# Patient Record
Sex: Female | Born: 1995 | Race: Black or African American | Hispanic: No | Marital: Single | Smoking: Never smoker
Health system: Southern US, Community
[De-identification: ages and names within clinical notes are randomized; demographics above are authoritative.]

## PROBLEM LIST (undated history)

## (undated) DIAGNOSIS — R51 Headache: Secondary | ICD-10-CM

## (undated) DIAGNOSIS — H539 Unspecified visual disturbance: Secondary | ICD-10-CM

## (undated) DIAGNOSIS — J45990 Exercise induced bronchospasm: Secondary | ICD-10-CM

## (undated) DIAGNOSIS — J45909 Unspecified asthma, uncomplicated: Secondary | ICD-10-CM

## (undated) HISTORY — DX: Unspecified asthma, uncomplicated: J45.909

## (undated) HISTORY — PX: NO PAST SURGERIES: SHX2092

## (undated) HISTORY — DX: Unspecified visual disturbance: H53.9

## (undated) HISTORY — DX: Exercise induced bronchospasm: J45.990

## (undated) HISTORY — DX: Headache: R51

---
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
## 2014-01-23 HISTORY — PX: LIPOMA EXCISION: SHX5283

## 2014-01-26 ENCOUNTER — Encounter: Payer: Self-pay | Admitting: Pediatrics

## 2014-01-28 ENCOUNTER — Encounter: Payer: Self-pay | Admitting: Pediatrics

## 2014-01-28 ENCOUNTER — Ambulatory Visit (INDEPENDENT_AMBULATORY_CARE_PROVIDER_SITE_OTHER): Payer: BC Managed Care – PPO | Admitting: Pediatrics

## 2014-01-28 VITALS — BP 120/78 | Ht 62.75 in | Wt 154.1 lb

## 2014-01-28 DIAGNOSIS — Z0000 Encounter for general adult medical examination without abnormal findings: Secondary | ICD-10-CM

## 2014-01-28 DIAGNOSIS — Z00129 Encounter for routine child health examination without abnormal findings: Secondary | ICD-10-CM | POA: Insufficient documentation

## 2014-01-28 DIAGNOSIS — Z68.41 Body mass index (BMI) pediatric, 5th percentile to less than 85th percentile for age: Secondary | ICD-10-CM | POA: Insufficient documentation

## 2014-01-28 NOTE — Progress Notes (Signed)
Subjective:     Peggy Roy is an 18 y.o. female who presents for college physical exam. She denies any current medical problems or concerns. Questionnaire and forms reviewed with patient and responses verified. Immunizations are up to date. Patient has received 2 doses of MMR vaccine. Varicella immunity: confirmed by vaccine administration.  The following portions of the patient's history were reviewed and updated as appropriate: allergies, current medications, past family history, past medical history, past social history, past surgical history and problem list.  Review of Systems Pertinent items are noted in HPI.     Objective:    BP 120/78  Ht 5' 2.75" (1.594 m)  Wt 154 lb 1.6 oz (69.899 kg)  BMI 27.51 kg/m2  Breastfeeding? Yes General appearance: alert, cooperative, appears stated age and no distress Head: Normocephalic, without obvious abnormality, atraumatic Eyes: conjunctivae/corneas clear. PERRL, EOM's intact. Fundi benign. Ears: normal TM's and external ear canals both ears Nose: Nares normal. Septum midline. Mucosa normal. No drainage or sinus tenderness. Throat: lips, mucosa, and tongue normal; teeth and gums normal Neck: no adenopathy, no carotid bruit, no JVD, supple, symmetrical, trachea midline and thyroid not enlarged, symmetric, no tenderness/mass/nodules Back: symmetric, no curvature. ROM normal. No CVA tenderness. Lungs: clear to auscultation bilaterally Heart: regular rate and rhythm, S1, S2 normal, no murmur, click, rub or gallop and normal apical impulse Abdomen: soft, non-tender; bowel sounds normal; no masses,  no organomegaly Extremities: extremities normal, atraumatic, no cyanosis or edema Pulses: 2+ and symmetric Skin: Skin color, texture, turgor normal. No rashes or lesions Lymph nodes: Cervical, supraclavicular, and axillary nodes normal. Neurologic: Alert and oriented X 3, normal strength and tone. Normal symmetric reflexes. Normal coordination and  gait    Assessment:    Satisfactory college physical exam.    Plan:    1. Completed, signed and returned forms. 2. Reviewed health maintenance, contraception, STDs, and substance abuse. 3. Follow up will be as needed.  4. Menactra, HepA#1, Varicella #2, Gardasil #1 given after discussing benefits and risks. 

## 2014-01-28 NOTE — Patient Instructions (Addendum)
Return in 2 months for Gardasil #2 Return in 6 months for HepA #2 Menactra is complete  Well Child Care - 65-19 Years Old SCHOOL PERFORMANCE  Your teenager should begin preparing for college or technical school. To keep your teenager on track, help him or her:   Prepare for college admissions exams and meet exam deadlines.   Fill out college or technical school applications and meet application deadlines.   Schedule time to study. Teenagers with part-time jobs may have difficulty balancing a job and schoolwork. SOCIAL AND EMOTIONAL DEVELOPMENT  Your teenager:  May seek privacy and spend less time with family.  May seem overly focused on himself or herself (self-centered).  May experience increased sadness or loneliness.  May also start worrying about his or her future.  Will want to make his or her own decisions (such as about friends, studying, or extracurricular activities).  Will likely complain if you are too involved or interfere with his or her plans.  Will develop more intimate relationships with friends. ENCOURAGING DEVELOPMENT  Encourage your teenager to:   Participate in sports or after-school activities.   Develop his or her interests.   Volunteer or join a Systems developer.  Help your teenager develop strategies to deal with and manage stress.  Encourage your teenager to participate in approximately 60 minutes of daily physical activity.   Limit television and computer time to 2 hours each day. Teenagers who watch excessive television are more likely to become overweight. Monitor television choices. Block channels that are not acceptable for viewing by teenagers. RECOMMENDED IMMUNIZATIONS  Hepatitis B vaccine. Doses of this vaccine may be obtained, if needed, to catch up on missed doses. A child or teenager aged 11-15 years can obtain a 2-dose series. The second dose in a 2-dose series should be obtained no earlier than 4 months after the first  dose.  Tetanus and diphtheria toxoids and acellular pertussis (Tdap) vaccine. A child or teenager aged 11-18 years who is not fully immunized with the diphtheria and tetanus toxoids and acellular pertussis (DTaP) or has not obtained a dose of Tdap should obtain a dose of Tdap vaccine. The dose should be obtained regardless of the length of time since the last dose of tetanus and diphtheria toxoid-containing vaccine was obtained. The Tdap dose should be followed with a tetanus diphtheria (Td) vaccine dose every 10 years. Pregnant adolescents should obtain 1 dose during each pregnancy. The dose should be obtained regardless of the length of time since the last dose was obtained. Immunization is preferred in the 27th to 36th week of gestation.  Haemophilus influenzae type b (Hib) vaccine. Individuals older than 19 years of age usually do not receive the vaccine. However, any unvaccinated or partially vaccinated individuals aged 54 years or older who have certain high-risk conditions should obtain doses as recommended.  Pneumococcal conjugate (PCV13) vaccine. Teenagers who have certain conditions should obtain the vaccine as recommended.  Pneumococcal polysaccharide (PPSV23) vaccine. Teenagers who have certain high-risk conditions should obtain the vaccine as recommended.  Inactivated poliovirus vaccine. Doses of this vaccine may be obtained, if needed, to catch up on missed doses.  Influenza vaccine. A dose should be obtained every year.  Measles, mumps, and rubella (MMR) vaccine. Doses should be obtained, if needed, to catch up on missed doses.  Varicella vaccine. Doses should be obtained, if needed, to catch up on missed doses.  Hepatitis A virus vaccine. A teenager who has not obtained the vaccine before 19 years of  age should obtain the vaccine if he or she is at risk for infection or if hepatitis A protection is desired.  Human papillomavirus (HPV) vaccine. Doses of this vaccine may be obtained,  if needed, to catch up on missed doses.  Meningococcal vaccine. A booster should be obtained at age 19 years. Doses should be obtained, if needed, to catch up on missed doses. Children and adolescents aged 11-18 years who have certain high-risk conditions should obtain 2 doses. Those doses should be obtained at least 8 weeks apart. Teenagers who are present during an outbreak or are traveling to a country with a high rate of meningitis should obtain the vaccine. TESTING Your teenager should be screened for:   Vision and hearing problems.   Alcohol and drug use.   High blood pressure.  Scoliosis.  HIV. Teenagers who are at an increased risk for hepatitis B should be screened for this virus. Your teenager is considered at high risk for hepatitis B if:  You were born in a country where hepatitis B occurs often. Talk with your health care provider about which countries are considered high-risk.  Your were born in a high-risk country and your teenager has not received hepatitis B vaccine.  Your teenager has HIV or AIDS.  Your teenager uses needles to inject street drugs.  Your teenager lives with, or has sex with, someone who has hepatitis B.  Your teenager is a female and has sex with other males (MSM).  Your teenager gets hemodialysis treatment.  Your teenager takes certain medicines for conditions like cancer, organ transplantation, and autoimmune conditions. Depending upon risk factors, your teenager may also be screened for:   Anemia.   Tuberculosis.   Cholesterol.   Sexually transmitted infections (STIs) including chlamydia and gonorrhea. Your teenager may be considered at risk for these STIs if:  He or she is sexually active.  His or her sexual activity has changed since last being screened and he or she is at an increased risk for chlamydia or gonorrhea. Ask your teenager's health care provider if he or she is at risk.  Pregnancy.   Cervical cancer. Most females  should wait until they turn 19 years old to have their first Pap test. Some adolescent girls have medical problems that increase the chance of getting cervical cancer. In these cases, the health care provider may recommend earlier cervical cancer screening.  Depression. The health care provider may interview your teenager without parents present for at least part of the examination. This can insure greater honesty when the health care provider screens for sexual behavior, substance use, risky behaviors, and depression. If any of these areas are concerning, more formal diagnostic tests may be done. NUTRITION  Encourage your teenager to help with meal planning and preparation.   Model healthy food choices and limit fast food choices and eating out at restaurants.   Eat meals together as a family whenever possible. Encourage conversation at mealtime.   Discourage your teenager from skipping meals, especially breakfast.   Your teenager should:   Eat a variety of vegetables, fruits, and lean meats.   Have 3 servings of low-fat milk and dairy products daily. Adequate calcium intake is important in teenagers. If your teenager does not drink milk or consume dairy products, he or she should eat other foods that contain calcium. Alternate sources of calcium include dark and leafy greens, canned fish, and calcium-enriched juices, breads, and cereals.   Drink plenty of water. Fruit juice should be limited  to 8-12 oz (240-360 mL) each day. Sugary beverages and sodas should be avoided.   Avoid foods high in fat, salt, and sugar, such as candy, chips, and cookies.  Body image and eating problems may develop at this age. Monitor your teenager closely for any signs of these issues and contact your health care provider if you have any concerns. ORAL HEALTH Your teenager should brush his or her teeth twice a day and floss daily. Dental examinations should be scheduled twice a year.  SKIN CARE  Your  teenager should protect himself or herself from sun exposure. He or she should wear weather-appropriate clothing, hats, and other coverings when outdoors. Make sure that your child or teenager wears sunscreen that protects against both UVA and UVB radiation.  Your teenager may have acne. If this is concerning, contact your health care provider. SLEEP Your teenager should get 8.5-9.5 hours of sleep. Teenagers often stay up late and have trouble getting up in the morning. A consistent lack of sleep can cause a number of problems, including difficulty concentrating in class and staying alert while driving. To make sure your teenager gets enough sleep, he or she should:   Avoid watching television at bedtime.   Practice relaxing nighttime habits, such as reading before bedtime.   Avoid caffeine before bedtime.   Avoid exercising within 3 hours of bedtime. However, exercising earlier in the evening can help your teenager sleep well.  PARENTING TIPS Your teenager may depend more upon peers than on you for information and support. As a result, it is important to stay involved in your teenager's life and to encourage him or her to make healthy and safe decisions.   Be consistent and fair in discipline, providing clear boundaries and limits with clear consequences.  Discuss curfew with your teenager.   Make sure you know your teenager's friends and what activities they engage in.  Monitor your teenager's school progress, activities, and social life. Investigate any significant changes.  Talk to your teenager if he or she is moody, depressed, anxious, or has problems paying attention. Teenagers are at risk for developing a mental illness such as depression or anxiety. Be especially mindful of any changes that appear out of character.  Talk to your teenager about:  Body image. Teenagers may be concerned with being overweight and develop eating disorders. Monitor your teenager for weight gain or  loss.  Handling conflict without physical violence.  Dating and sexuality. Your teenager should not put himself or herself in a situation that makes him or her uncomfortable. Your teenager should tell his or her partner if he or she does not want to engage in sexual activity. SAFETY   Encourage your teenager not to blast music through headphones. Suggest he or she wear earplugs at concerts or when mowing the lawn. Loud music and noises can cause hearing loss.   Teach your teenager not to swim without adult supervision and not to dive in shallow water. Enroll your teenager in swimming lessons if your teenager has not learned to swim.   Encourage your teenager to always wear a properly fitted helmet when riding a bicycle, skating, or skateboarding. Set an example by wearing helmets and proper safety equipment.   Talk to your teenager about whether he or she feels safe at school. Monitor gang activity in your neighborhood and local schools.   Encourage abstinence from sexual activity. Talk to your teenager about sex, contraception, and sexually transmitted diseases.   Discuss cell phone safety.  Discuss texting, texting while driving, and sexting.   Discuss Internet safety. Remind your teenager not to disclose information to strangers over the Internet. Home environment:  Equip your home with smoke detectors and change the batteries regularly. Discuss home fire escape plans with your teen.  Do not keep handguns in the home. If there is a handgun in the home, the gun and ammunition should be locked separately. Your teenager should not know the lock combination or where the key is kept. Recognize that teenagers may imitate violence with guns seen on television or in movies. Teenagers do not always understand the consequences of their behaviors. Tobacco, alcohol, and drugs:  Talk to your teenager about smoking, drinking, and drug use among friends or at friends' homes.   Make sure your  teenager knows that tobacco, alcohol, and drugs may affect brain development and have other health consequences. Also consider discussing the use of performance-enhancing drugs and their side effects.   Encourage your teenager to call you if he or she is drinking or using drugs, or if with friends who are.   Tell your teenager never to get in a car or boat when the driver is under the influence of alcohol or drugs. Talk to your teenager about the consequences of drunk or drug-affected driving.   Consider locking alcohol and medicines where your teenager cannot get them. Driving:  Set limits and establish rules for driving and for riding with friends.   Remind your teenager to wear a seat belt in cars and a life vest in boats at all times.   Tell your teenager never to ride in the bed or cargo area of a pickup truck.   Discourage your teenager from using all-terrain or motorized vehicles if younger than 16 years. WHAT'S NEXT? Your teenager should visit a pediatrician yearly.  Document Released: 08/23/2006 Document Revised: 10/12/2013 Document Reviewed: 02/10/2013 [facility] Patient Information 2015 [place], [place]. This information is not intended to replace advice given to you by your health care provider. Make sure you discuss any questions you have with your health care provider.

## 2014-01-28 NOTE — Progress Notes (Signed)
PHQ-9 screen score of 2. Score was for question "feeling tired or having little energy". Secily attributes it to breastfeeding and having a 25month old daughter.

## 2014-04-12 ENCOUNTER — Encounter: Payer: Self-pay | Admitting: Pediatrics

## 2015-06-13 ENCOUNTER — Emergency Department
Admission: EM | Admit: 2015-06-13 | Discharge: 2015-06-13 | Disposition: A | Discharge status: Home / Self Care | Payer: BLUE CROSS/BLUE SHIELD | Attending: Emergency Medicine | Admitting: Emergency Medicine

## 2015-06-13 DIAGNOSIS — R0981 Nasal congestion: Secondary | ICD-10-CM | POA: Diagnosis present

## 2015-06-13 DIAGNOSIS — Z79899 Other long term (current) drug therapy: Secondary | ICD-10-CM | POA: Insufficient documentation

## 2015-06-13 DIAGNOSIS — J0110 Acute frontal sinusitis, unspecified: Secondary | ICD-10-CM | POA: Insufficient documentation

## 2015-06-13 MED ORDER — IBUPROFEN 800 MG PO TABS: 800.0000 mg | ORAL_TABLET | Freq: Three times a day (TID) | ORAL | Status: DC | PRN

## 2015-06-13 MED ORDER — BENZONATATE 100 MG PO CAPS: 100.0000 mg | ORAL_CAPSULE | Freq: Three times a day (TID) | ORAL | Status: DC | PRN

## 2015-06-13 MED ORDER — AMOXICILLIN 500 MG PO CAPS: 500.0000 mg | ORAL_CAPSULE | Freq: Three times a day (TID) | ORAL | Status: DC

## 2015-06-13 MED ORDER — FEXOFENADINE-PSEUDOEPHED ER 60-120 MG PO TB12: 1.0000 | ORAL_TABLET | Freq: Two times a day (BID) | ORAL | Status: DC

## 2015-06-13 MED ORDER — FLUCONAZOLE 150 MG PO TABS: 150.0000 mg | ORAL_TABLET | Freq: Every day | ORAL | Status: DC

## 2015-06-13 NOTE — ED Notes (Signed)
Pt in with sinus congestion, cough, and earaches.

## 2015-06-13 NOTE — ED Provider Notes (Signed)
[facility] Emergency Department Provider Note  ____________________________________________  Time seen: Approximately 9:40 PM  I have reviewed the triage vital signs and the nursing notes.   HISTORY  Chief Complaint Nasal Congestion    HPI Peggy Roy is a 20 y.o. female patient complaining of 1 week of sinus congestion cough secondary to postnasal drainage bilaterally. She also states she's had intermittent frontal headaches. He denies any sore throat. Patient denies nausea vomiting diarrhea. Patient rates her pain discomfort as a 7/10 describe mostly as pressure. No palliative measures taken for this complaint.   Past Medical History  Diagnosis Date  . Headache(784.0)     migraines/tension  . Vision abnormalities     myopia/astigmatism  . 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 Asthma     activity induced    Patient Active Problem List   Diagnosis Date Noted  . Well child check 01/28/2014  . BMI (body mass index), pediatric, 5% to less than 85% for age 67/20/2015  . Myopia, astigmatism 01/16/2011  . 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 ZOXWRUEA(540.9)     Past Surgical History  Procedure Laterality Date  . No past surgeries    . Cesarean section N/A 04/19/2013    Procedure: CESAREAN SECTION;  Surgeon: Leslie Andrea, MD;  Location: [facility] OR[facility];  Service: Obstetrics;  Laterality: N/A;  . Lipoma excision Right 01/23/2014    Current Outpatient Rx  Name  Route  Sig  Dispense  Refill  . acetaminophen (TYLENOL) 500 MG tablet   Oral   Take 500 mg by mouth once.         Marland Kitchen amoxicillin (AMOXIL) 500 MG capsule   Oral   Take 1 capsule (500 mg total) by mouth 3 (three) times daily.   30 capsule   0   . benzonatate (TESSALON PERLES) 100 MG capsule   Oral   Take 1 capsule (100 mg total) by mouth 3 (three) times daily as needed for cough.   15 capsule   0   . Etonogestrel (NEXPLANON [place])    Subcutaneous   Inject into the skin.         . fexofenadine-pseudoephedrine (ALLEGRA-D) 60-120 MG 12 hr tablet   Oral   Take 1 tablet by mouth 2 (two) times daily.   30 tablet   0   . fluconazole (DIFLUCAN) 150 MG tablet   Oral   Take 1 tablet (150 mg total) by mouth daily.   1 tablet   0   . ibuprofen (ADVIL,MOTRIN) 600 MG tablet   Oral   Take 1 tablet (600 mg total) by mouth every 6 (six) hours.   30 tablet   1   . ibuprofen (ADVIL,MOTRIN) 800 MG tablet   Oral   Take 1 tablet (800 mg total) by mouth every 8 (eight) hours as needed.   30 tablet   0   . oxyCODONE-acetaminophen (PERCOCET/ROXICET) 5-325 MG per tablet   Oral   Take 1-2 tablets by mouth every 4 (four) hours as needed for severe pain (moderate - severe pain).   30 tablet   0   . Prenatal Vit-Fe Fumarate-FA (PRENATAL MULTIVITAMIN) TABS tablet   Oral   Take 1 tablet by mouth daily at 12 noon.           Allergies Review of patient's allergies indicates no known allergies.  Family History  Problem Relation Age of Onset  . Asthma Brother   . Miscarriages /  Stillbirths Sister   . Arthritis Maternal Grandmother   . Cancer Maternal Grandmother     breast  . Diabetes Maternal Grandmother   . Cancer Maternal Grandfather     prostate  . Diabetes Maternal Grandfather   . Dementia Maternal Grandfather   . Cancer Paternal Grandmother     bladder  . Depression Paternal Grandmother   . Alcohol abuse Neg Hx   . Birth defects Neg Hx   . COPD Neg Hx   . Drug abuse Neg Hx   . Early death Neg Hx   . Hearing loss Neg Hx   . Heart disease Neg Hx   . Hyperlipidemia Neg Hx   . Hypertension Neg Hx   . Kidney disease Neg Hx   . Learning disabilities Neg Hx   . Mental illness Neg Hx   . Mental retardation Neg Hx   . Stroke Neg Hx   . Vision loss Neg Hx   . Varicose Veins Neg Hx     Social History Social History  Substance Use Topics  . Smoking status: Never Smoker   . Smokeless tobacco: Not on file   . Alcohol Use: No    Review of Systems Constitutional: No fever/chills Eyes: No visual changes. ENT: No sore throat. Frontal sinus guarding. Bilateral edematous nasal turbinates. Take postnasal drainage. Cardiovascular: Denies chest pain. Respiratory: Denies shortness of breath. Nonproductive cough Gastrointestinal: No abdominal pain.  No nausea, no vomiting.  No diarrhea.  No constipation. Genitourinary: Negative for dysuria. Musculoskeletal: Negative for back pain. Skin: Negative for rash. Neurological: Negative for headaches, focal weakness or numbness. 10-point ROS otherwise negative.  ____________________________________________   PHYSICAL EXAM:  VITAL SIGNS: ED Triage Vitals  Enc Vitals Group     BP 06/13/15 2043 113/67 mmHg     Pulse Rate 06/13/15 2043 79     Resp 06/13/15 2043 18     Temp 06/13/15 2043 97.8 F (36.6 C)     Temp Source 06/13/15 2043 Oral     SpO2 06/13/15 2043 97 %     Weight 06/13/15 2043 145 lb (65.772 kg)     Height 06/13/15 2043 5\' 2"  (1.575 m)     Head Cir --      Peak Flow --      Pain Score 06/13/15 2043 7     Pain Loc --      Pain Edu? --      Excl. in GC? --     Constitutional: Alert and oriented. Well appearing and in no acute distress. Eyes: Conjunctivae are normal. PERRL. EOMI. Head: Atraumatic. Nose: Frontal sinus guarding with bilateral edematous nasal turbinates. Take greenish yellowish nasal discharge. Mouth/Throat: Mucous membranes are moist.  Oropharynx non-erythematous. Neck: No stridor.  No cervical spine tenderness to palpation. Hematological/Lymphatic/Immunilogical: No cervical lymphadenopathy. Cardiovascular: Normal rate, regular rhythm. Grossly normal heart sounds.  Good peripheral circulation. Respiratory: Normal respiratory effort.  No retractions. Lungs CTAB. Gastrointestinal: Soft and nontender. No distention. No abdominal bruits. No CVA tenderness. Musculoskeletal: No lower extremity tenderness nor edema.  No  joint effusions. Neurologic:  Normal speech and language. No gross focal neurologic deficits are appreciated. No gait instability. Skin:  Skin is warm, dry and intact. No rash noted. Psychiatric: Mood and affect are normal. Speech and behavior are normal.  ____________________________________________   LABS (all labs ordered are listed, but only abnormal results are displayed)  Labs Reviewed - No data to display ____________________________________________  EKG   ____________________________________________  RADIOLOGY   ____________________________________________  PROCEDURES  Procedure(s) performed: None  Critical Care performed: No  ____________________________________________   INITIAL IMPRESSION / ASSESSMENT AND PLAN / ED COURSE  Pertinent labs & imaging results that were available during my care of the patient were reviewed by me and considered in my medical decision making (see chart for details).  Sinusitis. Skin is prescription for amoxicillin, Allegra-D, and Tessalon Perles, and ibuprofen. Patient states she gets yeast infection from taking antibiotics. A prescription for Diflucan is provided. Follow-up family doctor ____________________________________________   FINAL CLINICAL IMPRESSION(S) / ED DIAGNOSES  Final diagnoses:  Acute frontal sinusitis, recurrence not specified      Joni Reining, PA-C 06/13/15 2151  Loleta Rose, MD 06/13/15 2329

## 2015-06-13 NOTE — Discharge Instructions (Signed)
Sinusitis, Adult  Sinusitis is redness, soreness, and puffiness (inflammation) of the air pockets in the bones of your face (sinuses). The redness, soreness, and puffiness can cause air and mucus to get trapped in your sinuses. This can allow germs to grow and cause an infection.   HOME CARE    Drink enough fluids to keep your pee (urine) clear or pale yellow.   Use a humidifier in your home.   Run a hot shower to create steam in the bathroom. Sit in the bathroom with the door closed. Breathe in the steam 3-4 times a day.   Put a warm, moist washcloth on your face 3-4 times a day, or as told by your doctor.   Use salt water sprays (saline sprays) to wet the thick fluid in your nose. This can help the sinuses drain.   Only take medicine as told by your doctor.  GET HELP RIGHT AWAY IF:    Your pain gets worse.   You have very bad headaches.   You are sick to your stomach (nauseous).   You throw up (vomit).   You are very sleepy (drowsy) all the time.   Your face is puffy (swollen).   Your vision changes.   You have a stiff neck.   You have trouble breathing.  MAKE SURE YOU:    Understand these instructions.   Will watch your condition.   Will get help right away if you are not doing well or get worse.     This information is not intended to replace advice given to you by your health care provider. Make sure you discuss any questions you have with your health care provider.     Document Released: 11/14/2007 Document Revised: 06/18/2014 Document Reviewed: 01/01/2012  Elsevier Interactive Patient Education 2016 [employer]

## 2016-04-15 ENCOUNTER — Emergency Department (HOSPITAL_COMMUNITY)
Admission: EM | Admit: 2016-04-15 | Discharge: 2016-04-15 | Disposition: A | Discharge status: Home / Self Care | Payer: Medicaid Other | Attending: Emergency Medicine | Admitting: Emergency Medicine

## 2016-04-15 ENCOUNTER — Encounter (HOSPITAL_COMMUNITY): Payer: Self-pay

## 2016-04-15 DIAGNOSIS — Z79899 Other long term (current) drug therapy: Secondary | ICD-10-CM | POA: Diagnosis not present

## 2016-04-15 DIAGNOSIS — M7989 Other specified soft tissue disorders: Secondary | ICD-10-CM | POA: Diagnosis present

## 2016-04-15 DIAGNOSIS — L139 Bullous disorder, unspecified: Secondary | ICD-10-CM | POA: Diagnosis not present

## 2016-04-15 DIAGNOSIS — J45909 Unspecified asthma, uncomplicated: Secondary | ICD-10-CM | POA: Insufficient documentation

## 2016-04-15 MED ORDER — CLOBETASOL PROPIONATE 0.05 % EX CREA: 1.0000 "application " | TOPICAL_CREAM | Freq: Two times a day (BID) | CUTANEOUS | 0 refills | Status: DC

## 2016-04-15 NOTE — Discharge Instructions (Addendum)
These are the dermatologist need to follow-up with tomorrow. I have written for cream that she can apply twice daily. Youe may take Tylenol and ibuprofen for pain. Please follow-up with dermatologist tomorrow. Return to the ED if you develop further blisters or if you have any blisters develop in her mouth.  [facility]:   [facility]  3.2 [phone]  Dermatologist  [address] [place]  [phone]   Dr. Mertha FindersJohn H. Hall Jr, MD  2.6 [phone]  Dermatologist  [address]  [phone]  [facility]  3.5 (3)  [facility]  [address]  [phone]   [facility]  4.0 (4)  Dermatologist  [address]  [phone]  Janalyn Harderafeen Stuart MD  3.0 (2)  Dermatologist  [address]  [phone]  Hoyle SauerMccoy Bruce  2.7 (6)  Dermatologist  [address]  [phone]  [place] Amy Y MD  2.0 (1)  Dermatologist  [address]  [phone]  [facility]  5.0 (3)  Doctor  [address]  [phone]

## 2016-04-15 NOTE — ED Provider Notes (Signed)
[facility]-EMERGENCY DEPT Provider Note   CSN: [id number] Arrival date & time: 04/15/16  1841  By signing my name below, I, Peggy Roy, attest that this documentation has been prepared under the direction and in the presence of Peggy Loll, PA-C.  Electronically Signed: Rosario Roy, ED Scribe. 04/15/16. 9:30 PM.  History   Chief Complaint Chief Complaint  Patient presents with  . Abscess   The history is provided by the patient. No language interpreter was used.   HPI Comments: Peggy Roy is a 20 y.o. female with no pertinent PMHx, who presents to the Emergency Department complaining of a moderate, gradually worsening area of pain and swelling to the left lower leg onset this morning. Pt describes her pain as burning and stinging. Pt states that she woke up with the area, however, it was considerably smaller this morning and has worsened throughout the day. She denies any recent trauma, burns, or tick exposure to the area to precipitate this issue. Pt states pain is exacerbated with palpation and direct pressure. She has been taking Advil with minimal relief of her pain or swelling. Denies fever, chills, nausea, vomiting, drainage from the area, or any other associated symptoms.   Past Medical History:  Diagnosis Date  . Asthma    activity induced  . 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 Headache(784.0)    migraines/tension  . Vision abnormalities    myopia/astigmatism   Patient Active Problem List   Diagnosis Date Noted  . Well child check 01/28/2014  . BMI (body mass index), pediatric, 5% to less than 85% for age 44/20/2015  . Myopia, astigmatism 01/16/2011  . 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 [id number](478.2)    Past Surgical History:  Procedure Laterality Date  . CESAREAN SECTION N/A 04/19/2013   Procedure: CESAREAN SECTION;  Surgeon: Leslie Andrea, MD;  Location: [facility];  Service: Obstetrics;   Laterality: N/A;  . LIPOMA EXCISION Right 01/23/2014  . NO PAST SURGERIES     OB History    Gravida Para Term Preterm AB Living   1 1   1   1    SAB TAB Ectopic Multiple Live Births           1     Home Medications    Prior to Admission medications   Medication Sig Start Date End Date Taking? Authorizing Provider  acetaminophen (TYLENOL) 500 MG tablet Take 500 mg by mouth once.    Historical Provider, MD  amoxicillin (AMOXIL) 500 MG capsule Take 1 capsule (500 mg total) by mouth 3 (three) times daily. 06/13/15   Joni Reining, PA-C  benzonatate (TESSALON PERLES) 100 MG capsule Take 1 capsule (100 mg total) by mouth 3 (three) times daily as needed for cough. 06/13/15   Joni Reining, PA-C  Etonogestrel [facility]) Inject into the skin.    Historical Provider, MD  fexofenadine-pseudoephedrine (ALLEGRA-D) 60-120 MG 12 hr tablet Take 1 tablet by mouth 2 (two) times daily. 06/13/15   Joni Reining, PA-C  fluconazole (DIFLUCAN) 150 MG tablet Take 1 tablet (150 mg total) by mouth daily. 06/13/15   Joni Reining, PA-C  ibuprofen (ADVIL,MOTRIN) 600 MG tablet Take 1 tablet (600 mg total) by mouth every 6 (six) hours. 04/22/13   Julio Sicks, NP  ibuprofen (ADVIL,MOTRIN) 800 MG tablet Take 1 tablet (800 mg total) by mouth every 8 (eight) hours as needed. 06/13/15   Joni Reining, PA-C  oxyCODONE-acetaminophen (PERCOCET/ROXICET)  5-325 MG per tablet Take 1-2 tablets by mouth every 4 (four) hours as needed for severe pain (moderate - severe pain). 04/22/13   Julio Sicksarol Curtis, NP  Prenatal Vit-Fe Fumarate-FA (PRENATAL MULTIVITAMIN) TABS tablet Take 1 tablet by mouth daily at 12 noon.    Historical Provider, MD   Family History Family History  Problem Relation Age of Onset  . Asthma Brother   . Miscarriages / Stillbirths Sister   . Arthritis Maternal Grandmother   . Cancer Maternal Grandmother     breast  . Diabetes Maternal Grandmother   . Cancer Maternal Grandfather     prostate  . Diabetes Maternal  Grandfather   . Dementia Maternal Grandfather   . Cancer Paternal Grandmother     bladder  . Depression Paternal Grandmother   . Alcohol abuse Neg Hx   . Birth defects Neg Hx   . COPD Neg Hx   . Drug abuse Neg Hx   . Early death Neg Hx   . Hearing loss Neg Hx   . Heart disease Neg Hx   . Hyperlipidemia Neg Hx   . Hypertension Neg Hx   . Kidney disease Neg Hx   . Learning disabilities Neg Hx   . Mental illness Neg Hx   . Mental retardation Neg Hx   . Stroke Neg Hx   . Vision loss Neg Hx   . Varicose Veins Neg Hx    Social History Social History  Substance Use Topics  . Smoking status: Never Smoker  . Smokeless tobacco: Never Used  . Alcohol use No   Allergies   Patient has no known allergies.   Review of Systems Review of Systems  Constitutional: Negative for chills and fever.  Gastrointestinal: Negative for nausea and vomiting.  Musculoskeletal: Positive for myalgias.  Skin: Positive for wound.  All other systems reviewed and are negative.  Physical Exam Updated Vital Signs BP 106/61 (BP Location: Left Arm)   Pulse 92   Temp 98.3 F (36.8 C) (Oral)   Resp 18   Ht 5\' 2"  (1.575 m)   Wt 145 lb (65.8 kg)   SpO2 98%   BMI 26.52 kg/m   Physical Exam  Constitutional: She appears well-developed and well-nourished.  HENT:  Head: Normocephalic.  No mucosal lesions noted.  Eyes: Conjunctivae are normal.  Cardiovascular: Normal rate.   Pulmonary/Chest: Effort normal. No respiratory distress.  Abdominal: She exhibits no distension.  Musculoskeletal: Normal range of motion.  Neurological: She is alert.  Skin: Skin is warm and dry. Capillary refill takes less than 2 seconds.     No other rash appreciated. DP pulses are 2+ bilaterally. Cap refills normal. Sensation intact.  Psychiatric: She has a normal mood and affect. Her behavior is normal.  Nursing note and vitals reviewed.  ED Treatments / Results  DIAGNOSTIC STUDIES: Oxygen Saturation is 98% on RA,  normal by my interpretation.   COORDINATION OF CARE: 9:30 PM-Discussed next steps with pt. Pt verbalized understanding and is agreeable with the plan.   Procedures Procedures   Medications Ordered in ED Medications - No data to display  Initial Impression / Assessment and Plan / ED Course  I have reviewed the triage vital signs and the nursing notes.  Pertinent labs & imaging results that were available during my care of the patient were reviewed by me and considered in my medical decision making (see chart for details).  Clinical Course    Pt is a 20yo female who presents to the  Emergency Department with a bullous eruption involving the LLE. No signs of cellulitis surrounding skin. Given Rx of clobetasol cream to apply BID . Will d/c home with dermatology follow up. Pt is comfortable with above plan and is stable for discharge at this time. All questions were answered prior to disposition. Strict return precautions for f/u into the ED were discussed.  Final Clinical Impressions(s) / ED Diagnoses   Final diagnoses:  Bullous eruption   New Prescriptions Discharge Medication List as of 04/15/2016 10:24 PM    START taking these medications   Details  clobetasol cream (TEMOVATE) 0.05 % Apply 1 application topically 2 (two) times daily., Starting Sun 04/15/2016, Print       I personally performed the services described in this documentation, which was scribed in my presence. The recorded information has been reviewed and is accurate.     Rise MuKenneth T Akasha Melena, PA-C 04/20/16 [id number]    Maia PlanJoshua G Long, MD 04/23/16 1149

## 2016-04-15 NOTE — ED Triage Notes (Signed)
Pt presents to ED with an approx 3 in x 2 in oval shaped blister on her L lower leg. Pt reports that she woke up and it was there. Denies being burned or bit by anything. Endorses shooting pain and states that it has grown throughout the day. A&Ox4. Ambulatory.

## 2016-09-18 ENCOUNTER — Other Ambulatory Visit
Admission: RE | Admit: 2016-09-18 | Discharge: 2016-09-18 | Disposition: A | Discharge status: Home / Self Care | Payer: Medicaid Other | Source: Ambulatory Visit | Attending: Gastroenterology | Admitting: Gastroenterology

## 2016-09-18 DIAGNOSIS — R197 Diarrhea, unspecified: Secondary | ICD-10-CM | POA: Insufficient documentation

## 2016-09-18 LAB — GASTROINTESTINAL PANEL BY PCR, STOOL (REPLACES STOOL CULTURE)
ADENOVIRUS F40/41: NOT DETECTED
ASTROVIRUS: NOT DETECTED
CAMPYLOBACTER SPECIES: NOT DETECTED
CYCLOSPORA CAYETANENSIS: NOT DETECTED
Cryptosporidium: NOT DETECTED
ENTAMOEBA HISTOLYTICA: NOT DETECTED
ENTEROTOXIGENIC E COLI (ETEC): NOT DETECTED
Enteroaggregative E coli (EAEC): NOT DETECTED
Enteropathogenic E coli (EPEC): NOT DETECTED
Giardia lamblia: NOT DETECTED
NOROVIRUS GI/GII: NOT DETECTED
Plesimonas shigelloides: NOT DETECTED
Rotavirus A: NOT DETECTED
SHIGA LIKE TOXIN PRODUCING E COLI (STEC): NOT DETECTED
Salmonella species: NOT DETECTED
Sapovirus (I, II, IV, and V): NOT DETECTED
Shigella/Enteroinvasive E coli (EIEC): NOT DETECTED
Vibrio cholerae: NOT DETECTED
Vibrio species: NOT DETECTED
Yersinia enterocolitica: NOT DETECTED

## 2017-04-16 ENCOUNTER — Encounter: Payer: Self-pay | Admitting: Obstetrics and Gynecology

## 2017-05-13 ENCOUNTER — Encounter: Payer: Self-pay | Admitting: Obstetrics and Gynecology

## 2017-06-27 ENCOUNTER — Ambulatory Visit (INDEPENDENT_AMBULATORY_CARE_PROVIDER_SITE_OTHER): Payer: Medicaid Other | Admitting: Obstetrics and Gynecology

## 2017-06-27 VITALS — BP 114/66 | HR 85 | Ht 64.0 in | Wt 137.8 lb

## 2017-06-27 DIAGNOSIS — Z0000 Encounter for general adult medical examination without abnormal findings: Secondary | ICD-10-CM | POA: Diagnosis not present

## 2017-06-27 DIAGNOSIS — Z3009 Encounter for other general counseling and advice on contraception: Secondary | ICD-10-CM

## 2017-06-27 DIAGNOSIS — Z975 Presence of (intrauterine) contraceptive device: Secondary | ICD-10-CM

## 2017-06-27 DIAGNOSIS — N760 Acute vaginitis: Secondary | ICD-10-CM

## 2017-06-27 DIAGNOSIS — Z01419 Encounter for gynecological examination (general) (routine) without abnormal findings: Secondary | ICD-10-CM

## 2017-06-27 NOTE — Patient Instructions (Addendum)
Preventive Care for [place], Female The transition to life after high school as a young adult can be a stressful time with many changes. You may start seeing a primary care physician instead of a pediatrician. This is the time when your health care becomes your responsibility. Preventive care refers to lifestyle choices and visits with your health care provider that can promote health and wellness. What does preventive care include?  A yearly physical exam. This is also called an annual wellness visit.  Dental exams once or twice a year.  Routine eye exams. Ask your health care provider how often you should have your eyes checked.  Personal lifestyle choices, including: ? Daily care of your teeth and gums. ? Regular physical activity. ? Eating a healthy diet. ? Avoiding tobacco and drug use. ? Avoiding or limiting alcohol use. ? Practicing safe sex. ? Taking vitamin and mineral supplements as recommended by your health care provider. What happens during an annual wellness visit? Preventive care starts with a yearly visit to your primary care physician. The services and screenings done by your health care provider during your annual wellness visit will depend on your overall health, lifestyle risk factors, and family history of disease. Counseling Your health care provider may ask you questions about:  Past medical problems and your family's medical history.  Medicines or supplements you take.  Health insurance and access to health care.  Alcohol, tobacco, and drug use.  Your safety at home, work, or school.  Access to firearms.  Emotional well-being and how you cope with stress.  Relationship well-being.  Diet, exercise, and sleep habits.  Your sexual health and activity.  Your methods of birth control.  Your menstrual cycle.  Your pregnancy history.  Screening You may have the following tests or measurements:  Height, weight, and BMI.  Blood  pressure.  Lipid and cholesterol levels.  Tuberculosis skin test.  Skin exam.  Vision and hearing tests.  Screening test for hepatitis.  Screening tests for sexually transmitted diseases (STDs), if you are at risk.  BRCA-related cancer screening. This may be done if you have a family history of breast, ovarian, tubal, or peritoneal cancers.  Pelvic exam and Pap test. This may be done every 3 years starting at age 36.  Vaccines Your health care provider may recommend certain vaccines, such as:  Influenza vaccine. This is recommended every year.  Tetanus, diphtheria, and acellular pertussis (Tdap, Td) vaccine. You may need a Td booster every 10 years.  Varicella vaccine. You may need this if you have not been vaccinated.  HPV vaccine. If you are 58 or younger, you may need three doses over 6 months.  Measles, mumps, and rubella (MMR) vaccine. You may need at least one dose of MMR. You may also need a second dose.  Pneumococcal 13-valent conjugate (PCV13) vaccine. You may need this if you have certain conditions and were not previously vaccinated.  Pneumococcal polysaccharide (PPSV23) vaccine. You may need one or two doses if you smoke cigarettes or if you have certain conditions.  Meningococcal vaccine. One dose is recommended if you are age 59-21 years and a first-year college student living in a residence hall, or if you have one of several medical conditions. You may also need additional booster doses.  Hepatitis A vaccine. You may need this if you have certain conditions or if you travel or work in places where you may be exposed to hepatitis A.  Hepatitis B vaccine. You may need this if  you have certain conditions or if you travel or work in places where you may be exposed to hepatitis B.  Haemophilus influenzae type b (Hib) vaccine. You may need this if you have certain risk factors.  Talk to your health care provider about which screenings and vaccines you need and how  often you need them. What steps can I take to develop healthy behaviors?  Have regular preventive health care visits with your primary care physician and dentist.  Eat a healthy diet.  Drink enough fluid to keep your urine clear or pale yellow.  Stay active. Exercise at least 30 minutes 5 or more days of the week.  Use alcohol responsibly.  Maintain a healthy weight.  Do not use any products that contain nicotine, such as cigarettes, chewing tobacco, and e-cigarettes. If you need help quitting, ask your health care provider.  Do not use drugs.  Practice safe sex.  Use birth control (contraception) to prevent unwanted pregnancy. If you plan to become pregnant, see your health care provider for a pre-conception visit.  Find healthy ways to manage stress. How can I protect myself from injury? Injuries from violence or accidents are the leading cause of death among young adults and can often be prevented. Take these steps to help protect yourself:  Always wear your seat belt while driving or riding in a vehicle.  Do not drive if you have been drinking alcohol. Do not ride with someone who has been drinking.  Do not drive when you are tired or distracted. Do not text while driving.  Wear a helmet and other protective equipment during sports activities.  If you have firearms in your house, make sure you follow all gun safety procedures.  Seek help if you have been bullied, physically abused, or sexually abused.  Use the Internet responsibly to avoid dangers such as online bullying and online sexual predators.  What can I do to cope with stress? Young adults may face many new challenges that can be stressful, such as finding a job, going to college, moving away from home, managing money, being in a relationship, getting married, and having children. To manage stress:  Avoid known stressful situations when you can.  Exercise regularly.  Find a stress-reducing activity that  works best for you. Examples include meditation, yoga, listening to music, or reading.  Spend time in nature.  Keep a journal to write about your stress and how you respond.  Talk to your health care provider about stress. He or she may suggest counseling.  Spend time with supportive friends or family.  Do not cope with stress by: ? Drinking alcohol or using drugs. ? Smoking cigarettes. ? Eating.  Where can I get more information? Learn more about preventive care and healthy habits from:  [place] and Gynecologists: KaraokeExchange.nl  U.S. Probation officer Task Force: StageSync.si  National Adolescent and [place]: StrategicRoad.nl  American Academy of Pediatrics Bright Futures: https://brightfutures.MemberVerification.co.za  Society for Adolescent Health and Medicine: MoralBlog.co.za.aspx  PodExchange.nl: ToyLending.fr  This information is not intended to replace advice given to you by your health care provider. Make sure you discuss any questions you have with your health care provider. Document Released: 10/13/2015 Document Revised: 11/03/2015 Document Reviewed: 10/13/2015 Elsevier Interactive Patient Education  [employer].

## 2017-06-27 NOTE — Progress Notes (Signed)
GYNECOLOGY CLINIC ENCOUNTER NOTE Subjective:     Peggy Roy is a 22 y.o. 441P0101 female here for to establish care and discuss birth control options. Current complaints: irregular bleeding on Nexplanon.  The patient is sexually active, 1 female partner. The patient wears seatbelts: yes. The patient participates in regular exercise: yes, 3-4 times weekly. Has the patient ever been transfused or tattooed?: no. The patient reports that there is not domestic violence in her life.    Gynecologic History No LMP recorded. Patient has had an implant. Contraception: Nexplanon. Inserted 06/2016.  Last Pap: 07/2016. Results were: normal   Obstetric History OB History  Gravida Para Term Preterm AB Living  1 1   1   1   SAB TAB Ectopic Multiple Live Births          1    # Outcome Date GA Lbr Len/2nd Weight Sex Delivery Anes PTL Lv  1 Preterm 04/19/13 [id number]  5 lb 3.3 oz (2.36 kg) F CS-LTranv Spinal  LIV      Past Medical History:  Diagnosis Date  . Asthma    activity induced  . 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 Headache(784.0)    migraines/tension  . Vision abnormalities    myopia/astigmatism    Family History  Problem Relation Age of Onset  . Asthma Brother   . Miscarriages / Stillbirths Sister   . Arthritis Maternal Grandmother   . Cancer Maternal Grandmother        breast  . Diabetes Maternal Grandmother   . Cancer Maternal Grandfather        prostate  . Diabetes Maternal Grandfather   . Dementia Maternal Grandfather   . Cancer Paternal Grandmother        bladder  . Depression Paternal Grandmother   . Alcohol abuse Neg Hx   . Birth defects Neg Hx   . COPD Neg Hx   . Drug abuse Neg Hx   . Early death Neg Hx   . Hearing loss Neg Hx   . Heart disease Neg Hx   . Hyperlipidemia Neg Hx   . Hypertension Neg Hx   . Kidney disease Neg Hx   . Learning disabilities Neg Hx   . Mental illness Neg Hx   . Mental retardation Neg Hx   . Stroke Neg Hx   . Vision loss  Neg Hx   . Varicose Veins Neg Hx     Past Surgical History:  Procedure Laterality Date  . CESAREAN SECTION N/A 04/19/2013   Procedure: CESAREAN SECTION;  Surgeon: Leslie AndreaJames E Tomblin II, MD;  Location: [facility] ORS;  Service: Obstetrics;  Laterality: N/A;  . LIPOMA EXCISION Right 01/23/2014  . NO PAST SURGERIES      Social History   Socioeconomic History  . Marital status: Single    Spouse name: Not on file  . Number of children: 1  . Years of education: Not on file  . Highest education level: Not on file  Social Needs  . Financial resource strain: Not on file  . Food insecurity - worry: Not on file  . Food insecurity - inability: Not on file  . Transportation needs - medical: Not on file  . Transportation needs - non-medical: Not on file  Occupational History  . Not on file  Tobacco Use  . Smoking status: Never Smoker  . Smokeless tobacco: Never Used  Substance and Sexual Activity  . Alcohol use: No  . Drug use: No  . Sexual  activity: Not Currently    Birth control/protection: Implant, Condom  Other Topics Concern  . Not on file  Social History Narrative   Beginning freshman year at [employer]   Has a 67mo daughter who will staying with Christee's mom while she is at school    Current Outpatient Medications on File Prior to Visit  Medication Sig Dispense Refill  . Etonogestrel (NEXPLANON [place]) Inject into the skin.    Marland Kitchen acetaminophen (TYLENOL) 500 MG tablet Take 500 mg by mouth once.    Marland Kitchen amoxicillin (AMOXIL) 500 MG capsule Take 1 capsule (500 mg total) by mouth 3 (three) times daily. (Patient not taking: Reported on 06/27/2017) 30 capsule 0  . benzonatate (TESSALON PERLES) 100 MG capsule Take 1 capsule (100 mg total) by mouth 3 (three) times daily as needed for cough. (Patient not taking: Reported on 06/27/2017) 15 capsule 0  . clobetasol cream (TEMOVATE) 0.05 % Apply 1 application topically 2 (two) times daily. (Patient not taking: Reported on 06/27/2017) 30 g 0  .  fexofenadine-pseudoephedrine (ALLEGRA-D) 60-120 MG 12 hr tablet Take 1 tablet by mouth 2 (two) times daily. (Patient not taking: Reported on 06/27/2017) 30 tablet 0  . fluconazole (DIFLUCAN) 150 MG tablet Take 1 tablet (150 mg total) by mouth daily. (Patient not taking: Reported on 06/27/2017) 1 tablet 0  . ibuprofen (ADVIL,MOTRIN) 600 MG tablet Take 1 tablet (600 mg total) by mouth every 6 (six) hours. (Patient not taking: Reported on 06/27/2017) 30 tablet 1  . ibuprofen (ADVIL,MOTRIN) 800 MG tablet Take 1 tablet (800 mg total) by mouth every 8 (eight) hours as needed. (Patient not taking: Reported on 06/27/2017) 30 tablet 0  . oxyCODONE-acetaminophen (PERCOCET/ROXICET) 5-325 MG per tablet Take 1-2 tablets by mouth every 4 (four) hours as needed for severe pain (moderate - severe pain). (Patient not taking: Reported on 06/27/2017) 30 tablet 0  . Prenatal Vit-Fe Fumarate-FA (PRENATAL MULTIVITAMIN) TABS tablet Take 1 tablet by mouth daily at 12 noon.     No current facility-administered medications on file prior to visit.     No Known Allergies   Review of Systems Constitutional: negative for chills, fatigue, fevers and sweats Eyes: negative for irritation, redness and visual disturbance Ears, nose, mouth, throat, and face: negative for hearing loss, nasal congestion, snoring and tinnitus Respiratory: negative for asthma, cough, sputum Cardiovascular: negative for chest pain, dyspnea, exertional chest pressure/discomfort, irregular heart beat, palpitations and syncope Gastrointestinal: negative for abdominal pain, change in bowel habits, nausea and vomiting Genitourinary: Positive for recurrent vaginal infections (yeast or BV), irregular menses on Nexplanon. Negative for genital lesions, sexual problems, dysuria and urinary incontinence Integument/breast: negative for breast lump, breast tenderness and nipple discharge Hematologic/lymphatic: negative for bleeding and easy  bruising Musculoskeletal:negative for back pain and muscle weakness Neurological: negative for dizziness, headaches, vertigo and weakness Endocrine: negative for diabetic symptoms including polydipsia, polyuria and skin dryness Allergic/Immunologic: negative for hay fever and urticaria      Objective:    BP 114/66 (BP Location: Left Arm, Patient Position: Sitting)   Pulse 85   Ht 5\' 4"  (1.626 m)   Wt 137 lb 12.8 oz (62.5 kg)   BMI 23.65 kg/m   General Appearance:    Alert, cooperative, no distress, appears stated age  Head:    Normocephalic, without obvious abnormality, atraumatic  Eyes:    PERRL, conjunctiva/corneas clear, EOM's intact,, both eyes  Ears:    Normal external ear canals, both ears  Nose:   Nares normal, septum midline,  mucosa normal, no drainage    or sinus tenderness  Throat:   Lips, mucosa, and tongue normal; teeth and gums normal  Neck:   Supple, symmetrical, trachea midline, no adenopathy;    thyroid:  no enlargement/tenderness/nodules; no carotid   bruit or JVD  Back:     Symmetric, no curvature, ROM normal, no CVA tenderness  Lungs:     Clear to auscultation bilaterally, respirations unlabored  Chest Wall:    No tenderness or deformity   Heart:    Regular rate and rhythm, S1 and S2 normal, no murmur, rub   or gallop  Breast Exam:    No tenderness, masses, or nipple abnormality  Abdomen:     Soft, non-tender, bowel sounds active all four quadrants,    no masses, no organomegaly  Genitalia:    Normal female without lesion, discharge or tenderness  Rectal:    Not performed.   Extremities:   Extremities normal, atraumatic, no cyanosis or edema  Pulses:   2+ and symmetric all extremities  Skin:   Skin color, texture, turgor normal, no rashes or lesions  Lymph nodes:   Cervical, supraclavicular, and axillary nodes normal  Neurologic:   CNII-XII intact, normal strength, sensation and reflexes    throughout      Assessment:    Healthy female exam.    Contraception counseling Nexplanon in place  H/o recurrent vaginitis  Plan:   - Labs: CBC and CMP - Education reviewed: safe sex/STD prevention, self breast exams and weight bearing exercise. - Contraception: Reviewed all forms of birth control options available including abstinence; over the counter/barrier methods; hormonal contraceptive medication including pill, patch, ring, injection,contraceptive implant; hormonal and nonhormonal IUDs; permanent sterilization options including vasectomy and the various tubal sterilization modalities. Risks and benefits reviewed.  Questions were answered.  Information was given to patient to review. She is considering an IUD.  - Discussed conservative methods of treatment for recurrent vaginitis. 1. eat a healthy diet rich in fruits and vegetables and low in sugar and refined carbohydrates increase garlic intake, 2. Support immune system: reduce stress, get adequate sleep and exercise regularly, 3. Eat plain, probiotic yogurt daily or take a daily vaginal probiotic, 4. Avoid harsh soaps, and detergents, 5.Avoid routine douching, and if you notice that sex can trigger infections for you, use condoms. For some women who get infections easily, both douching and the alkaline nature of semen can disrupt the vaginal pH. - Declined STD screen.  - Declined flu vaccine.  - F/u in 1 year for annual exam. Return sooner for IUD insertion when desired.    A total of 45 minutes were spent face-to-face with the patient during the encounter with greater than 50% dealing with counseling and coordination of care.  Hildred Laser, MD [facility]

## 2017-06-28 ENCOUNTER — Encounter: Payer: Self-pay | Admitting: Obstetrics and Gynecology

## 2017-06-28 LAB — COMPREHENSIVE METABOLIC PANEL
A/G RATIO: 1.3 (ref 1.2–2.2)
ALK PHOS: 87 IU/L (ref 39–117)
ALT: 15 IU/L (ref 0–32)
AST: 22 IU/L (ref 0–40)
Albumin: 4.4 g/dL (ref 3.5–5.5)
BILIRUBIN TOTAL: 0.3 mg/dL (ref 0.0–1.2)
BUN/Creatinine Ratio: 11 (ref 9–23)
BUN: 9 mg/dL (ref 6–20)
CHLORIDE: 101 mmol/L (ref 96–106)
CO2: 24 mmol/L (ref 20–29)
Calcium: 10 mg/dL (ref 8.7–10.2)
Creatinine, Ser: 0.84 mg/dL (ref 0.57–1.00)
GFR calc Af Amer: 114 mL/min/{1.73_m2} (ref >59)
GFR calc non Af Amer: 99 mL/min/{1.73_m2} (ref >59)
GLOBULIN, TOTAL: 3.4 g/dL (ref 1.5–4.5)
Glucose: 77 mg/dL (ref 65–99)
POTASSIUM: 4.2 mmol/L (ref 3.5–5.2)
SODIUM: 138 mmol/L (ref 134–144)
Total Protein: 7.8 g/dL (ref 6.0–8.5)

## 2017-06-28 LAB — CBC
HEMATOCRIT: 42.4 % (ref 34.0–46.6)
Hemoglobin: 14.2 g/dL (ref 11.1–15.9)
MCH: 31.8 pg (ref 26.6–33.0)
MCHC: 33.5 g/dL (ref 31.5–35.7)
MCV: 95 fL (ref 79–97)
Platelets: 335 10*3/uL (ref 150–379)
RBC: 4.47 x10E6/uL (ref 3.77–5.28)
RDW: 13.6 % (ref 12.3–15.4)
WBC: 3.6 10*3/uL (ref 3.4–10.8)

## 2017-07-01 ENCOUNTER — Telehealth: Payer: Self-pay | Admitting: Obstetrics and Gynecology

## 2017-07-01 NOTE — Telephone Encounter (Signed)
Left message on machine to call back   Notes recorded by Hildred Laserherry, Anika, MD on 06/28/2017 at 9:35 PM EST Please inform patient of normal labs.

## 2017-07-04 NOTE — Telephone Encounter (Signed)
Left message on machine to call back  

## 2017-07-16 ENCOUNTER — Ambulatory Visit (INDEPENDENT_AMBULATORY_CARE_PROVIDER_SITE_OTHER): Payer: Medicaid Other | Admitting: Obstetrics and Gynecology

## 2017-07-16 ENCOUNTER — Encounter: Payer: Self-pay | Admitting: Obstetrics and Gynecology

## 2017-07-16 VITALS — BP 100/67 | HR 77 | Wt 138.5 lb

## 2017-07-16 DIAGNOSIS — Z30430 Encounter for insertion of intrauterine contraceptive device: Secondary | ICD-10-CM

## 2017-07-16 DIAGNOSIS — Z3046 Encounter for surveillance of implantable subdermal contraceptive: Secondary | ICD-10-CM

## 2017-07-16 NOTE — Progress Notes (Signed)
    GYNECOLOGY OFFICE PROCEDURE NOTE  Peggy ChampagneLoren G Roy is a 22 y.o. G1P0101 here for Nexplanon removal and IUD insertion.  Last pap smear was on 07/2016 and was normal.  No other gynecologic concerns.   Nexplanon Removal Patient identified, informed consent performed.   Appropriate time out taken. Nexplanon site identified.  Area prepped in usual sterile fashon. One ml of 1% lidocaine was used to anesthetize the area at the distal end of the implant. A small stab incision was made right beside the implant on the distal portion.  The Nexplanon rod was grasped using hemostats and removed without difficulty.  There was minimal blood loss. There were no complications. 3 ml of 1% lidocaine was injected around the incision for post-procedure analgesia.  Steri-strips were applied over the small incision.  A pressure bandage was applied to reduce any bruising.  The patient tolerated the procedure well and was given post procedure instructions.     IUD Insertion Procedure Note Patient identified, informed consent performed, consent signed.   Discussed risks of irregular bleeding, cramping, infection, malpositioning or misplacement of the IUD outside the uterus which may require further procedure such as laparoscopy. Time out was performed.  Urine pregnancy test negative.   Speculum placed in the vagina.  Cervix visualized.  Cleaned with Betadine x 2.  Grasped anteriorly with a single tooth tenaculum.  Uterus sounded to 8.5 cm.  Mirena IUD placed per manufacturer's recommendations.  Strings trimmed to 3 cm. Tenaculum was removed, good hemostasis noted.  Patient tolerated procedure well.   Patient was given post-procedure instructions.  She was advised to have backup contraception for one week.  Patient was also asked to check IUD strings periodically and follow up in 4 weeks for IUD check.   Lot: [id number] Exp: 10/2019    Hildred Laserherry, Peggy Cloninger, MD [facility]

## 2017-07-16 NOTE — Patient Instructions (Signed)
NEXPLANON PLACEMENT POST-PROCEDURE INSTRUCTIONS  1. You may take Ibuprofen, Aleve or Tylenol for pain if needed.  Pain should resolve within in 24 hours.  2. You may have intercourse after 24 hours.  If you using this for birth control, it is effective immediately.  3. You need to call if you have any fever, heavy bleeding, or redness at insertion site. Irregular bleeding is common the first several months after having a Nexplanonplaced. You do not need to call for this reason unless you are concerned.  4. Shower or bathe as normal.  You can remove the bandage after 24 hours.      IUD PLACEMENT POST-PROCEDURE INSTRUCTIONS  5. You may take Ibuprofen, Aleve or Tylenol for pain if needed.  Cramping should resolve within in 24 hours.  6. You may have a small amount of spotting.  You should wear a mini pad for the next few days.  7. You may have intercourse after 24 hours.  If you using this for birth control, it is effective immediately.  8. You need to call if you have any pelvic pain, fever, heavy bleeding or foul smelling vaginal discharge.  Irregular bleeding is common the first several months after having an IUD placed. You do not need to call for this reason unless you are concerned.  9. Shower or bathe as normal  10. You should have a follow-up appointment in 4-8 weeks for a re-check to make sure you are not having any problems.

## 2017-09-24 ENCOUNTER — Encounter: Payer: Medicaid Other | Admitting: Obstetrics and Gynecology

## 2017-10-23 ENCOUNTER — Ambulatory Visit (INDEPENDENT_AMBULATORY_CARE_PROVIDER_SITE_OTHER): Payer: Commercial Managed Care - PPO | Admitting: Obstetrics and Gynecology

## 2017-10-23 ENCOUNTER — Encounter: Payer: Self-pay | Admitting: Obstetrics and Gynecology

## 2017-10-23 VITALS — BP 110/73 | HR 73 | Ht 62.0 in | Wt 147.3 lb

## 2017-10-23 DIAGNOSIS — Z30431 Encounter for routine checking of intrauterine contraceptive device: Secondary | ICD-10-CM

## 2017-10-23 DIAGNOSIS — Z113 Encounter for screening for infections with a predominantly sexual mode of transmission: Secondary | ICD-10-CM

## 2017-10-23 NOTE — Progress Notes (Signed)
    GYNECOLOGY PROGRESS NOTE  Subjective:    Patient ID: Peggy Roy, female    DOB: [birth year], 22 y.o.   MRN: [id number]  HPI  Patient is a 22 y.o. G48P0101 female who presents today for IUD string check; Mirena IUD was placed  07/16/2017. No complaints about the IUD, no concerning side effects.  Does note that she is still spotting.  Patient also desires STD screening.  Notes that had unprotected sex with her partner ~ 1 month ago. She has been with the same partner for over 9 months and does not think she has had any exposure, but just wanted to be tested to be reassured.   The following portions of the patient's history were reviewed and updated as appropriate: allergies, current medications, past family history, past medical history, past social history, past surgical history and problem list. Last pap smear on 07/2016 was normal, negative HRHPV.  Review of Systems:  Pertinent items are noted in HPI.   Objective:  Physical Exam Blood pressure 110/73, pulse 73, height  (1.575 m), weight 147 lb 4.8 oz (66.8 kg), currently breastfeeding. CONSTITUTIONAL: Well-developed, well-nourished female in no acute distress.  ABDOMEN: Soft, no distention noted.   PELVIC: Normal appearing external genitalia; normal appearing vaginal mucosa and cervix.  IUD strings visualized, about 2 cm in length outside cervix.  EXTREMITIES: extremities normal, atraumatic, no cyanosis or edema NEUROLOGIC: Grossly normal  Assessment & Plan:  Normal IUD check.  Discussed spotting and menstrual irregularities are anticipated in the first 3-6 months after insertion.  Patient to keep IUD in place for five years; can come in for removal if she desires pregnancy within the next five years. Routine preventative health maintenance measures emphasized. Encouraged safe sex practices. STD screening performed today at patient's request.  Patient is planning on relocating to [place] in the next several months. Will send  records to new GYN once established.     Hildred Laser, MD [facility]

## 2017-10-23 NOTE — Progress Notes (Signed)
Pt is present today for IUD string/placement check. Pt stated that she wanted to have a STD panel done due to her having unprotected sex. No other concerns.

## 2017-10-24 LAB — HIV ANTIBODY (ROUTINE TESTING W REFLEX): HIV SCREEN 4TH GENERATION: NONREACTIVE

## 2017-10-24 LAB — RPR: RPR: NONREACTIVE

## 2017-10-25 LAB — GC/CHLAMYDIA PROBE AMP
Chlamydia trachomatis, NAA: NEGATIVE
NEISSERIA GONORRHOEAE BY PCR: NEGATIVE

## 2018-06-10 ENCOUNTER — Other Ambulatory Visit (HOSPITAL_COMMUNITY)
Admission: RE | Admit: 2018-06-10 | Discharge: 2018-06-10 | Disposition: A | Discharge status: Home / Self Care | Payer: Self-pay | Source: Ambulatory Visit | Attending: Obstetrics and Gynecology | Admitting: Obstetrics and Gynecology

## 2018-06-10 ENCOUNTER — Encounter: Payer: Self-pay | Admitting: Obstetrics and Gynecology

## 2018-06-10 ENCOUNTER — Ambulatory Visit (INDEPENDENT_AMBULATORY_CARE_PROVIDER_SITE_OTHER): Payer: Commercial Managed Care - PPO | Admitting: Obstetrics and Gynecology

## 2018-06-10 VITALS — BP 118/81 | HR 96 | Ht 62.0 in | Wt 137.6 lb

## 2018-06-10 DIAGNOSIS — Z124 Encounter for screening for malignant neoplasm of cervix: Secondary | ICD-10-CM

## 2018-06-10 DIAGNOSIS — Z01419 Encounter for gynecological examination (general) (routine) without abnormal findings: Secondary | ICD-10-CM | POA: Diagnosis not present

## 2018-06-10 DIAGNOSIS — Z975 Presence of (intrauterine) contraceptive device: Secondary | ICD-10-CM | POA: Diagnosis not present

## 2018-06-10 NOTE — Progress Notes (Addendum)
GYNECOLOGY ANNUAL PHYSICAL EXAM PROGRESS NOTE  Subjective:    Peggy Roy is a 22 y.o. G55P0101 female who presents for an routine gynecologic exam. The patient has no complaints today. She has relocated to [place], however is in town visiting her daughter. The patient is sexually active (same partner, female).  The patient wears seatbelts: yes. The patient participates in regular exercise: yes. Has the patient ever been transfused or tattooed?: no. The patient reports that there is not domestic violence in her life.    Gynecologic History  Menarche age: 17 No LMP recorded. (Menstrual status: IUD). Contraception: IUD Christean Grief, inserted 07/16/2017) History of STI's: Denies Last Pap: patient thinks 2018 but is unsure (may have just been a pelvic exam). Denies/Notes h/o abnormal pap smears.   OB History  Gravida Para Term Preterm AB Living  1 1 0 1 0 1  SAB TAB Ectopic Multiple Live Births  0 0 0 0 1    # Outcome Date GA Lbr Len/2nd Weight Sex Delivery Anes PTL Lv  1 Preterm 04/19/13 [redacted]w[redacted]d  5 lb 3.3 oz (2.36 kg) F CS-LTranv Spinal  LIV     Name: DELL, HURTUBISE     Apgar1: 8  Apgar5: 9    Past Medical History:  Diagnosis Date  . Asthma    activity induced  . 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 Exercise induced bronchospasm   . Headache(784.0)    migraines/tension  . Vision abnormalities    myopia/astigmatism    Past Surgical History:  Procedure Laterality Date  . CESAREAN SECTION N/A 04/19/2013   Procedure: CESAREAN SECTION;  Surgeon: Leslie Andrea, MD;  Location: [facility];  Service: Obstetrics;  Laterality: N/A;  . LIPOMA EXCISION Right 01/23/2014  . NO PAST SURGERIES      Family History  Problem Relation Age of Onset  . Asthma Brother   . Miscarriages / Stillbirths Sister   . Arthritis Maternal Grandmother   . Cancer Maternal Grandmother        breast  . Diabetes Maternal Grandmother   . Cancer Maternal Grandfather        prostate  . Diabetes  Maternal Grandfather   . Dementia Maternal Grandfather   . Cancer Paternal Grandmother        bladder  . Depression Paternal Grandmother   . Alcohol abuse Neg Hx   . Birth defects Neg Hx   . COPD Neg Hx   . Drug abuse Neg Hx   . Early death Neg Hx   . Hearing loss Neg Hx   . Heart disease Neg Hx   . Hyperlipidemia Neg Hx   . Hypertension Neg Hx   . Kidney disease Neg Hx   . Learning disabilities Neg Hx   . Mental illness Neg Hx   . Mental retardation Neg Hx   . Stroke Neg Hx   . Vision loss Neg Hx   . Varicose Veins Neg Hx     Social History   Socioeconomic History  . Marital status: Single    Spouse name: Not on file  . Number of children: 1  . Years of education: Not on file  . Highest education level: Not on file  Occupational History  . Not on file  Social Needs  . Financial resource strain: Not on file  . Food insecurity:    Worry: Not on file    Inability: Not on file  . Transportation needs:    Medical: Not on file  Non-medical: Not on file  Tobacco Use  . Smoking status: Never Smoker  . Smokeless tobacco: Never Used  Substance and Sexual Activity  . Alcohol use: Yes    Comment: occas  . Drug use: No  . Sexual activity: Yes    Birth control/protection: Condom, I.U.D.  Lifestyle  . Physical activity:    Days per week: Not on file    Minutes per session: Not on file  . Stress: Not on file  Relationships  . Social connections:    Talks on phone: Not on file    Gets together: Not on file    Attends religious service: Not on file    Active member of club or organization: Not on file    Attends meetings of clubs or organizations: Not on file    Relationship status: Not on file  . Intimate partner violence:    Fear of current or ex partner: Not on file    Emotionally abused: Not on file    Physically abused: Not on file    Forced sexual activity: Not on file  Other Topics Concern  . Not on file  Social History Narrative   Beginning freshman  year at [employer]   Has a 48mo daughter who will staying with Anarie's mom while she is at school    Current Outpatient Medications on File Prior to Visit  Medication Sig Dispense Refill  . acetaminophen (TYLENOL) 500 MG tablet Take 500 mg by mouth once.    . Levonorgestrel (MIRENA, 52 MG, IU) 1 Device by Intrauterine route.     No current facility-administered medications on file prior to visit.     No Known Allergies    Review of Systems Constitutional: negative for chills, fatigue, fevers and sweats Eyes: negative for irritation, redness and visual disturbance Ears, nose, mouth, throat, and face: negative for hearing loss, nasal congestion, snoring and tinnitus Respiratory: negative for asthma, cough, sputum Cardiovascular: negative for chest pain, dyspnea, exertional chest pressure/discomfort, irregular heart beat, palpitations and syncope Gastrointestinal: negative for abdominal pain, change in bowel habits, nausea and vomiting Genitourinary: negative for abnormal menstrual periods, genital lesions, sexual problems and vaginal discharge, dysuria and urinary incontinence Integument/breast: negative for breast lump, breast tenderness and nipple discharge Hematologic/lymphatic: negative for bleeding and easy bruising Musculoskeletal:negative for back pain and muscle weakness Neurological: negative for dizziness, headaches, vertigo and weakness Endocrine: negative for diabetic symptoms including polydipsia, polyuria and skin dryness Allergic/Immunologic: negative for hay fever and urticaria       Objective:  Blood pressure 118/81, pulse 96, height 5\' 2"  (1.575 m), weight 137 lb 9.6 oz (62.4 kg), currently breastfeeding. Body mass index is 25.17 kg/m.  General Appearance:    Alert, cooperative, no distress, appears stated age  Head:    Normocephalic, without obvious abnormality, atraumatic  Eyes:    PERRL, conjunctiva/corneas clear, EOM's intact, both eyes  Ears:    Normal  external ear canals, both ears  Nose:   Nares normal, septum midline, mucosa normal, no drainage or sinus tenderness  Throat:   Lips, mucosa, and tongue normal; teeth and gums normal  Neck:   Supple, symmetrical, trachea midline, no adenopathy; thyroid: no enlargement/tenderness/nodules; no carotid bruit or JVD  Back:     Symmetric, no curvature, ROM normal, no CVA tenderness  Lungs:     Clear to auscultation bilaterally, respirations unlabored  Chest Wall:    No tenderness or deformity   Heart:    Regular rate and rhythm, S1 and  S2 normal, no murmur, rub or gallop  Breast Exam:    No tenderness, masses, or nipple abnormality  Abdomen:     Soft, non-tender, bowel sounds active all four quadrants, no masses, no organomegaly.    Genitalia:    Pelvic:external genitalia normal, vagina without lesions, discharge, or tenderness, rectovaginal septum  normal. Cervix normal in appearance, no cervical motion tenderness, no adnexal masses or tenderness. IUD threads visualized. Uterus normal size, shape, mobile, regular contours, nontender.  Rectal:    Normal external sphincter.  No hemorrhoids appreciated. Internal exam not done.   Extremities:   Extremities normal, atraumatic, no cyanosis or edema  Pulses:   2+ and symmetric all extremities  Skin:   Skin color, texture, turgor normal, no rashes or lesions  Lymph nodes:   Cervical, supraclavicular, and axillary nodes normal  Neurologic:   CNII-XII intact, normal strength, sensation and reflexes throughout   .  Labs:  Lab Results  Component Value Date   WBC 3.6 06/27/2017   HGB 14.2 06/27/2017   HCT 42.4 06/27/2017   MCV 95 06/27/2017   PLT 335 06/27/2017    Lab Results  Component Value Date   CREATININE 0.84 06/27/2017   BUN 9 06/27/2017   NA 138 06/27/2017   K 4.2 06/27/2017   CL 101 06/27/2017   CO2 24 06/27/2017    Lab Results  Component Value Date   ALT 15 06/27/2017   AST 22 06/27/2017   ALKPHOS 87 06/27/2017   BILITOT 0.3  06/27/2017    No results found for: TSH   Assessment:     Routine gynecologic exam.    IUD in place  Plan:    Labs: CBC and CMP - Education reviewed: safe sex/STD prevention, self breast exams and weight bearing exercise. - Contraception: Skyla IUD in place.  - Declined flu vaccine.  - Declines flu vaccine.  - F/u in 1 year for annual exam.     Hildred Laserherry, Lauro Manlove, MD [facility]

## 2018-06-10 NOTE — Patient Instructions (Signed)

## 2018-06-10 NOTE — Progress Notes (Signed)
Pt is present today for her annual exam. Pt stated that she is doing well no problems.

## 2018-06-11 LAB — COMPREHENSIVE METABOLIC PANEL
A/G RATIO: 1.7 (ref 1.2–2.2)
ALBUMIN: 4.5 g/dL (ref 3.5–5.5)
ALT: 14 IU/L (ref 0–32)
AST: 17 IU/L (ref 0–40)
Alkaline Phosphatase: 69 IU/L (ref 39–117)
BILIRUBIN TOTAL: 0.7 mg/dL (ref 0.0–1.2)
BUN / CREAT RATIO: 11 (ref 9–23)
BUN: 9 mg/dL (ref 6–20)
CHLORIDE: 101 mmol/L (ref 96–106)
CO2: 22 mmol/L (ref 20–29)
Calcium: 10 mg/dL (ref 8.7–10.2)
Creatinine, Ser: 0.85 mg/dL (ref 0.57–1.00)
GFR calc Af Amer: 112 mL/min/{1.73_m2} (ref >59)
GFR calc non Af Amer: 97 mL/min/{1.73_m2} (ref >59)
Globulin, Total: 2.7 g/dL (ref 1.5–4.5)
Glucose: 93 mg/dL (ref 65–99)
POTASSIUM: 3.8 mmol/L (ref 3.5–5.2)
Sodium: 138 mmol/L (ref 134–144)
TOTAL PROTEIN: 7.2 g/dL (ref 6.0–8.5)

## 2018-06-11 LAB — CBC
HEMOGLOBIN: 14.3 g/dL (ref 11.1–15.9)
Hematocrit: 41.1 % (ref 34.0–46.6)
MCH: 32.4 pg (ref 26.6–33.0)
MCHC: 34.8 g/dL (ref 31.5–35.7)
MCV: 93 fL (ref 79–97)
Platelets: 303 10*3/uL (ref 150–450)
RBC: 4.41 x10E6/uL (ref 3.77–5.28)
RDW: 12 % — ABNORMAL LOW (ref 12.3–15.4)
WBC: 3.9 10*3/uL (ref 3.4–10.8)

## 2018-06-13 LAB — CYTOLOGY - PAP
Chlamydia: NEGATIVE
Diagnosis: NEGATIVE
Neisseria Gonorrhea: NEGATIVE

## 2018-07-07 ENCOUNTER — Encounter: Payer: Commercial Managed Care - PPO | Admitting: Obstetrics and Gynecology

## 2018-07-08 ENCOUNTER — Telehealth: Payer: Self-pay | Admitting: Obstetrics and Gynecology

## 2018-07-08 NOTE — Telephone Encounter (Signed)
The patient called stating she has been bleeding since last Wednesday after intercourse. The patient states she has not had any bleeding after sex since her IUD was inserted. She stated she was not in any pain. She would like a call back.Thanks

## 2018-07-09 ENCOUNTER — Encounter: Payer: Commercial Managed Care - PPO | Admitting: Obstetrics and Gynecology

## 2018-07-10 NOTE — Telephone Encounter (Signed)
Pt stated the bleeding has stopped and she was having some cramping. Pt was advised to take tylenol as needed 500mg  x2 as directed. Pt was advised that if she noticed the bleeding again to call and make an appointment to be [facility] by [facility]. Pt stated that she understood.

## 2018-08-28 ENCOUNTER — Telehealth: Payer: Self-pay | Admitting: Obstetrics and Gynecology

## 2018-08-28 MED ORDER — VALACYCLOVIR HCL 1 G PO TABS: 1000.0000 mg | ORAL_TABLET | Freq: Two times a day (BID) | ORAL | 0 refills | Status: DC

## 2018-08-28 NOTE — Telephone Encounter (Signed)
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 Valentino Saxon can send in the script for her to pick up, please advise, thanks.

## 2018-08-28 NOTE — Telephone Encounter (Signed)
Pt called and informed that her medication Valtrex could be refilled. Pt requested for medication to be sent to [facility] in [place] on [address]. Medication was sent to the pharmacy and pt is aware.

## 2018-09-04 ENCOUNTER — Other Ambulatory Visit: Payer: Self-pay | Admitting: Obstetrics and Gynecology

## 2018-09-29 ENCOUNTER — Other Ambulatory Visit: Payer: Self-pay | Admitting: Obstetrics and Gynecology

## 2018-09-30 ENCOUNTER — Other Ambulatory Visit: Payer: Self-pay | Admitting: Obstetrics and Gynecology

## 2018-10-02 NOTE — Telephone Encounter (Signed)
Pt stated that she was taking the medication for vaginal outbreak. Please advise. Thanks [employer]

## 2018-10-17 ENCOUNTER — Other Ambulatory Visit: Payer: Self-pay | Admitting: Obstetrics and Gynecology

## 2019-06-19 ENCOUNTER — Encounter: Payer: Commercial Managed Care - PPO | Admitting: Obstetrics and Gynecology

## 2019-09-07 NOTE — Progress Notes (Signed)
Pt present annual exam. Pt stated that she was doing well no problems.

## 2019-09-07 NOTE — Patient Instructions (Addendum)
Preventive Care 21-25 Years Old, Female Preventive care refers to visits with your health care provider and lifestyle choices that can promote health and wellness. This includes:  A yearly physical exam. This may also be called an annual well check.  Regular dental visits and eye exams.  Immunizations.  Screening for certain conditions.  Healthy lifestyle choices, such as eating a healthy diet, getting regular exercise, not using drugs or products that contain nicotine and tobacco, and limiting alcohol use. What can I expect for my preventive care visit? Physical exam Your health care provider will check your:  Height and weight. This may be used to calculate body mass index (BMI), which tells if you are at a healthy weight.  Heart rate and blood pressure.  Skin for abnormal spots. Counseling Your health care provider may ask you questions about your:  Alcohol, tobacco, and drug use.  Emotional well-being.  Home and relationship well-being.  Sexual activity.  Eating habits.  Work and work environment.  Method of birth control.  Menstrual cycle.  Pregnancy history. What immunizations do I need?  Influenza (flu) vaccine  This is recommended every year. Tetanus, diphtheria, and pertussis (Tdap) vaccine  You may need a Td booster every 10 years. Varicella (chickenpox) vaccine  You may need this if you have not been vaccinated. Human papillomavirus (HPV) vaccine  If recommended by your health care provider, you may need three doses over 6 months. Measles, mumps, and rubella (MMR) vaccine  You may need at least one dose of MMR. You may also need a second dose. Meningococcal conjugate (MenACWY) vaccine  One dose is recommended if you are age 19-21 years and a first-year college student living in a residence hall, or if you have one of several medical conditions. You may also need additional booster doses. Pneumococcal conjugate (PCV13) vaccine  You may need  this if you have certain conditions and were not previously vaccinated. Pneumococcal polysaccharide (PPSV23) vaccine  You may need one or two doses if you smoke cigarettes or if you have certain conditions. Hepatitis A vaccine  You may need this if you have certain conditions or if you travel or work in places where you may be exposed to hepatitis A. Hepatitis B vaccine  You may need this if you have certain conditions or if you travel or work in places where you may be exposed to hepatitis B. Haemophilus influenzae type b (Hib) vaccine  You may need this if you have certain conditions. You may receive vaccines as individual doses or as more than one vaccine together in one shot (combination vaccines). Talk with your health care provider about the risks and benefits of combination vaccines. What tests do I need?  Blood tests  Lipid and cholesterol levels. These may be checked every 5 years starting at age 20.  Hepatitis C test.  Hepatitis B test. Screening  Diabetes screening. This is done by checking your blood sugar (glucose) after you have not eaten for a while (fasting).  Sexually transmitted disease (STD) testing.  BRCA-related cancer screening. This may be done if you have a family history of breast, ovarian, tubal, or peritoneal cancers.  Pelvic exam and Pap test. This may be done every 3 years starting at age 21. Starting at age 30, this may be done every 5 years if you have a Pap test in combination with an HPV test. Talk with your health care provider about your test results, treatment options, and if necessary, the need for more tests.   Follow these instructions at home: Eating and drinking   Eat a diet that includes fresh fruits and vegetables, whole grains, lean protein, and low-fat dairy.  Take vitamin and mineral supplements as recommended by your health care provider.  Do not drink alcohol if: ? Your health care provider tells you not to drink. ? You are  pregnant, may be pregnant, or are planning to become pregnant.  If you drink alcohol: ? Limit how much you have to 0-1 drink a day. ? Be aware of how much alcohol is in your drink. In the [place] one drink equals one 12 oz bottle of beer (355 mL), one 5 oz glass of wine (148 mL), or one 1 oz glass of hard liquor (44 mL). Lifestyle  Take daily care of your teeth and gums.  Stay active. Exercise for at least 30 minutes on 5 or more days each week.  Do not use any products that contain nicotine or tobacco, such as cigarettes, e-cigarettes, and chewing tobacco. If you need help quitting, ask your health care provider.  If you are sexually active, practice safe sex. Use a condom or other form of birth control (contraception) in order to prevent pregnancy and STIs (sexually transmitted infections). If you plan to become pregnant, see your health care provider for a preconception visit. What's next?  Visit your health care provider once a year for a well check visit.  Ask your health care provider how often you should have your eyes and teeth checked.  Stay up to date on all vaccines. This information is not intended to replace advice given to you by your health care provider. Make sure you discuss any questions you have with your health care provider. Document Revised: 02/06/2018 Document Reviewed: 02/06/2018 Elsevier Patient Education  2020 [employer] Breast Self-Awareness Breast self-awareness is knowing how your breasts look and feel. Doing breast self-awareness is important. It allows you to catch a breast problem early while it is still small and can be treated. All women should do breast self-awareness, including women who have had breast implants. Tell your doctor if you notice a change in your breasts. What you need:  A mirror.  A well-lit room. How to do a breast self-exam A breast self-exam is one way to learn what is normal for your breasts and to check for changes. To do a  breast self-exam: Look for changes  1. Take off all the clothes above your waist. 2. Stand in front of a mirror in a room with good lighting. 3. Put your hands on your hips. 4. Push your hands down. 5. Look at your breasts and nipples in the mirror to see if one breast or nipple looks different from the other. Check to see if: ? The shape of one breast is different. ? The size of one breast is different. ? There are wrinkles, dips, and bumps in one breast and not the other. 6. Look at each breast for changes in the skin, such as: ? Redness. ? Scaly areas. 7. Look for changes in your nipples, such as: ? Liquid around the nipples. ? Bleeding. ? Dimpling. ? Redness. ? A change in where the nipples are. Feel for changes  1. Lie on your back on the floor. 2. Feel each breast. To do this, follow these steps: ? Pick a breast to feel. ? Put the arm closest to that breast above your head. ? Use your other arm to feel the nipple area of your breast. Feel   breast. Feel the area with the pads of your three middle fingers by making small circles with your fingers. For the first circle, press lightly. For the second circle, press harder. For the third circle, press even harder. ? Keep making circles with your fingers at the different pressures as you move down your breast. Stop when you feel your ribs. ? Move your fingers a little toward the center of your body. ? Start making circles with your fingers again, this time going up until you reach your collarbone. ? Keep making up-and-down circles until you reach your armpit. Remember to keep using the three pressures. ? Feel the other breast in the same way. 3. Sit or stand in the tub or shower. 4. With soapy water on your skin, feel each breast the same way you did in step 2 when you were lying on the floor. Write down what you find Writing down what you find can help you remember what to tell your doctor. Write down:  What is normal for each breast.  Any  changes you find in each breast, including: ? The kind of changes you find. ? Whether you have pain. ? Size and location of any lumps.  When you last had your menstrual period. General tips  Check your breasts every month.  If you are breastfeeding, the best time to check your breasts is after you feed your baby or after you use a breast pump.  If you get menstrual periods, the best time to check your breasts is 5-7 days after your menstrual period is over.  With time, you will become comfortable with the self-exam, and you will begin to know if there are changes in your breasts. Contact a doctor if you:  See a change in the shape or size of your breasts or nipples.  See a change in the skin of your breast or nipples, such as red or scaly skin.  Have fluid coming from your nipples that is not normal.  Find a lump or thick area that was not there before.  Have pain in your breasts.  Have any concerns about your breast health. Summary  Breast self-awareness includes looking for changes in your breasts, as well as feeling for changes within your breasts.  Breast self-awareness should be done in front of a mirror in a well-lit room.  You should check your breasts every month. If you get menstrual periods, the best time to check your breasts is 5-7 days after your menstrual period is over.  Let your doctor know of any changes you see in your breasts, including changes in size, changes on the skin, pain or tenderness, or fluid from your nipples that is not normal. This information is not intended to replace advice given to you by your health care provider. Make sure you discuss any questions you have with your health care provider. Document Revised: 01/14/2018 Document Reviewed: 01/14/2018 Elsevier Patient Education  [place].

## 2019-09-08 ENCOUNTER — Encounter: Payer: Self-pay | Admitting: Obstetrics and Gynecology

## 2019-09-08 ENCOUNTER — Other Ambulatory Visit: Payer: Self-pay

## 2019-09-08 ENCOUNTER — Other Ambulatory Visit (HOSPITAL_COMMUNITY)
Admission: RE | Admit: 2019-09-08 | Discharge: 2019-09-08 | Disposition: A | Discharge status: Home / Self Care | Payer: 59 | Source: Ambulatory Visit | Attending: Obstetrics and Gynecology | Admitting: Obstetrics and Gynecology

## 2019-09-08 ENCOUNTER — Ambulatory Visit (INDEPENDENT_AMBULATORY_CARE_PROVIDER_SITE_OTHER): Payer: 59 | Admitting: Obstetrics and Gynecology

## 2019-09-08 VITALS — BP 101/69 | HR 76 | Ht 62.0 in | Wt 131.7 lb

## 2019-09-08 DIAGNOSIS — Z975 Presence of (intrauterine) contraceptive device: Secondary | ICD-10-CM

## 2019-09-08 DIAGNOSIS — Z01419 Encounter for gynecological examination (general) (routine) without abnormal findings: Secondary | ICD-10-CM | POA: Diagnosis present

## 2019-09-08 NOTE — Addendum Note (Signed)
Addended by: Silvano Bilis on: 09/08/2019 05:07 PM   Modules accepted: Orders

## 2019-09-08 NOTE — Progress Notes (Signed)
GYNECOLOGY CLINIC ENCOUNTER NOTE Subjective:     Peggy Roy is a 24 y.o. G76P0101 female here for routine annual exam. Current complaints: none  The patient is sexually active, same partner. The patient wears seatbelts: yes. The patient participates in regular exercise: yes, 3-4 times weekly. Has the patient ever been transfused or tattooed?: no. The patient reports that there is not domestic violence in her life.    Gynecologic History No LMP recorded (lmp unknown). (Menstrual status: IUD). Contraception: IUD. Inserted 07/2017.  Last Pap: 07/2016. Results were: normal   Obstetric History OB History  Gravida Para Term Preterm AB Living  1 1   1   1   SAB TAB Ectopic Multiple Live Births          1    # Outcome Date GA Lbr Len/2nd Weight Sex Delivery Anes PTL Lv  1 Preterm 04/19/13 [redacted]w[redacted]d  5 lb 3.3 oz (2.36 kg) F CS-LTranv Spinal  LIV    Past Medical History:  Diagnosis Date  . Asthma    activity induced  . 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 Exercise induced bronchospasm   . Headache(784.0)    migraines/tension  . Vision abnormalities    myopia/astigmatism    Family History  Problem Relation Age of Onset  . Asthma Brother   . Miscarriages / Stillbirths Sister   . Arthritis Maternal Grandmother   . Cancer Maternal Grandmother        breast  . Diabetes Maternal Grandmother   . Cancer Maternal Grandfather        prostate  . Diabetes Maternal Grandfather   . Dementia Maternal Grandfather   . Cancer Paternal Grandmother        bladder  . Depression Paternal Grandmother   . Healthy Mother   . Healthy Father   . Alcohol abuse Neg Hx   . Birth defects Neg Hx   . COPD Neg Hx   . Drug abuse Neg Hx   . Early death Neg Hx   . Hearing loss Neg Hx   . Heart disease Neg Hx   . Hyperlipidemia Neg Hx   . Hypertension Neg Hx   . Kidney disease Neg Hx   . Learning disabilities Neg Hx   . Mental illness Neg Hx   . Mental retardation Neg Hx   . Stroke Neg Hx    . Vision loss Neg Hx   . Varicose Veins Neg Hx     Past Surgical History:  Procedure Laterality Date  . CESAREAN SECTION N/A 04/19/2013   Procedure: CESAREAN SECTION;  Surgeon: 13/02/2013, MD;  Location: [facility];  Service: Obstetrics;  Laterality: N/A;  . LIPOMA EXCISION Right 01/23/2014  . NO PAST SURGERIES      Social History   Socioeconomic History  . Marital status: Single    Spouse name: Not on file  . Number of children: 1  . Years of education: Not on file  . Highest education level: Not on file  Occupational History  . Not on file  Tobacco Use  . Smoking status: Never Smoker  . Smokeless tobacco: Never Used  Substance and Sexual Activity  . Alcohol use: Yes    Comment: occas  . Drug use: No  . Sexual activity: Yes    Birth control/protection: Condom, I.U.D.  Other Topics Concern  . Not on file  Social History Narrative   Beginning freshman year at 01/25/2014   Has a 84mo daughter who  will staying with [place]'s mom while she is at school   Social Determinants of Health   Financial Resource Strain:   . Difficulty of Paying Living Expenses:   Food Insecurity:   . Worried About Programme researcher, broadcasting/film/video in the Last Year:   . Barista in the Last Year:   Transportation Needs:   . Freight forwarder (Medical):   Marland Kitchen Lack of Transportation (Non-Medical):   Physical Activity:   . Days of Exercise per Week:   . Minutes of Exercise per Session:   Stress:   . Feeling of Stress :   Social Connections:   . Frequency of Communication with Friends and Family:   . Frequency of Social Gatherings with Friends and Family:   . Attends Religious Services:   . Active Member of Clubs or Organizations:   . Attends Banker Meetings:   Marland Kitchen Marital Status:   Intimate Partner Violence:   . Fear of Current or Ex-Partner:   . Emotionally Abused:   Marland Kitchen Physically Abused:   . Sexually Abused:     Current Outpatient Medications on File Prior to Visit   Medication Sig Dispense Refill  . acetaminophen (TYLENOL) 500 MG tablet Take 500 mg by mouth once.    . Levonorgestrel (MIRENA, 52 MG, IU) 1 Device by Intrauterine route.    . valACYclovir (VALTREX) 1000 MG tablet TAKE 1 TABLET (1,000 MG TOTAL) BY MOUTH DAILY. TAKE 1 TAB FOR 5 DAYS FOR ACTIVE OUTBREAK. 30 tablet 1  . fluconazole (DIFLUCAN) 150 MG tablet TAKE 1 TABLET BY MOUTH SINGLE DOSE REPEAT IN ONE WEEK IF NEEDED (Patient not taking: Reported on 09/08/2019) 2 tablet 0   No current facility-administered medications on file prior to visit.    No Known Allergies   Review of Systems Constitutional: negative for chills, fatigue, fevers and sweats Eyes: negative for irritation, redness and visual disturbance Ears, nose, mouth, throat, and face: negative for hearing loss, nasal congestion, snoring and tinnitus Respiratory: negative for asthma, cough, sputum Cardiovascular: negative for chest pain, dyspnea, exertional chest pressure/discomfort, irregular heart beat, palpitations and syncope Gastrointestinal: negative for abdominal pain, change in bowel habits, nausea and vomiting Genitourinary: Negative for genital lesions, sexual problems, vaginal dischargedysuria and urinary incontinence Integument/breast: negative for breast lump, breast tenderness and nipple discharge Hematologic/lymphatic: negative for bleeding and easy bruising Musculoskeletal:negative for back pain and muscle weakness Neurological: negative for dizziness, headaches, vertigo and weakness Endocrine: negative for diabetic symptoms including polydipsia, polyuria and skin dryness Allergic/Immunologic: negative for hay fever and urticaria.    Objective:    BP 101/69   Pulse 76   Ht 5\' 2"  (1.575 m)   Wt 131 lb 11.2 oz (59.7 kg)   LMP  (LMP Unknown)   BMI 24.09 kg/m   General Appearance:    Alert, cooperative, no distress, appears stated age  Head:    Normocephalic, without obvious abnormality, atraumatic  Eyes:     PERRL, conjunctiva/corneas clear, EOM's intact,, both eyes  Ears:    Normal external ear canals, both ears  Nose:   Nares normal, septum midline, mucosa normal, no drainage    or sinus tenderness  Throat:   Lips, mucosa, and tongue normal; teeth and gums normal  Neck:   Supple, symmetrical, trachea midline, no adenopathy;    thyroid:  no enlargement/tenderness/nodules; no carotid   bruit or JVD  Back:     Symmetric, no curvature, ROM normal, no CVA tenderness  Lungs:  Clear to auscultation bilaterally, respirations unlabored  Chest Wall:    No tenderness or deformity   Heart:    Regular rate and rhythm, S1 and S2 normal, no murmur, rub   or gallop  Breast Exam:    No tenderness, masses, or nipple abnormality  Abdomen:     Soft, non-tender, bowel sounds active all four quadrants,    no masses, no organomegaly  Genitalia:    Normal female exam without lesion, discharge or tenderness.  IUD threads visible, 2 cm in length.   Rectal:    Not performed.   Extremities:   Extremities normal, atraumatic, no cyanosis or edema  Pulses:   2+ and symmetric all extremities  Skin:   Skin color, texture, turgor normal, no rashes or lesions  Lymph nodes:   Cervical, supraclavicular, and axillary nodes normal  Neurologic:   CNII-XII intact, normal strength, sensation and reflexes    throughout    Assessment:   Healthy female exam. IUDin place  Plan:   - Labs: CBC and CMP - Pap smear performed today.  - Education reviewed: safe sex/STD prevention, self breast exams and weight bearing exercise. - Contraception: IUD.  - Declined flu vaccine.  - Discussed COVID vaccination. Answered all questions.  - F/u in 1 year for annual exam. Return sooner for IUD insertion when desired.     Rubie Maid, MD [facility]

## 2019-09-09 LAB — COMPREHENSIVE METABOLIC PANEL
ALT: 22 IU/L (ref 0–32)
AST: 20 IU/L (ref 0–40)
Albumin/Globulin Ratio: 1.7 (ref 1.2–2.2)
Albumin: 4 g/dL (ref 3.9–5.0)
Alkaline Phosphatase: 96 IU/L (ref 39–117)
BUN/Creatinine Ratio: 16 (ref 9–23)
BUN: 15 mg/dL (ref 6–20)
Bilirubin Total: 0.3 mg/dL (ref 0.0–1.2)
CO2: 26 mmol/L (ref 20–29)
Calcium: 9.3 mg/dL (ref 8.7–10.2)
Chloride: 103 mmol/L (ref 96–106)
Creatinine, Ser: 0.96 mg/dL (ref 0.57–1.00)
GFR calc Af Amer: 96 mL/min/{1.73_m2} (ref >59)
GFR calc non Af Amer: 83 mL/min/{1.73_m2} (ref >59)
Globulin, Total: 2.3 g/dL (ref 1.5–4.5)
Glucose: 81 mg/dL (ref 65–99)
Potassium: 4.3 mmol/L (ref 3.5–5.2)
Sodium: 139 mmol/L (ref 134–144)
Total Protein: 6.3 g/dL (ref 6.0–8.5)

## 2019-09-09 LAB — CBC
Hematocrit: 39.9 % (ref 34.0–46.6)
Hemoglobin: 13.7 g/dL (ref 11.1–15.9)
MCH: 34.1 pg — ABNORMAL HIGH (ref 26.6–33.0)
MCHC: 34.3 g/dL (ref 31.5–35.7)
MCV: 99 fL — ABNORMAL HIGH (ref 79–97)
Platelets: 281 10*3/uL (ref 150–450)
RBC: 4.02 x10E6/uL (ref 3.77–5.28)
RDW: 12 % (ref 11.7–15.4)
WBC: 4.8 10*3/uL (ref 3.4–10.8)

## 2019-09-10 LAB — CYTOLOGY - PAP: Diagnosis: NEGATIVE

## 2020-09-13 NOTE — Patient Instructions (Addendum)
Preventive Care 21-26 Years Old, Female Preventive care refers to lifestyle choices and visits with your health care provider that can promote health and wellness. This includes:  A yearly physical exam. This is also called an annual wellness visit.  Regular dental and eye exams.  Immunizations.  Screening for certain conditions.  Healthy lifestyle choices, such as: ? Eating a healthy diet. ? Getting regular exercise. ? Not using drugs or products that contain nicotine and tobacco. ? Limiting alcohol use. What can I expect for my preventive care visit? Physical exam Your health care provider may check your:  Height and weight. These may be used to calculate your BMI (body mass index). BMI is a measurement that tells if you are at a healthy weight.  Heart rate and blood pressure.  Body temperature.  Skin for abnormal spots. Counseling Your health care provider may ask you questions about your:  Past medical problems.  Family's medical history.  Alcohol, tobacco, and drug use.  Emotional well-being.  Home life and relationship well-being.  Sexual activity.  Diet, exercise, and sleep habits.  Work and work environment.  Access to firearms.  Method of birth control.  Menstrual cycle.  Pregnancy history. What immunizations do I need? Vaccines are usually given at various ages, according to a schedule. Your health care provider will recommend vaccines for you based on your age, medical history, and lifestyle or other factors, such as travel or where you work.   What tests do I need? Blood tests  Lipid and cholesterol levels. These may be checked every 5 years starting at age 20.  Hepatitis C test.  Hepatitis B test. Screening  Diabetes screening. This is done by checking your blood sugar (glucose) after you have not eaten for a while (fasting).  STD (sexually transmitted disease) testing, if you are at risk.  BRCA-related cancer screening. This may be  done if you have a family history of breast, ovarian, tubal, or peritoneal cancers.  Pelvic exam and Pap test. This may be done every 3 years starting at age 21. Starting at age 30, this may be done every 5 years if you have a Pap test in combination with an HPV test. Talk with your health care provider about your test results, treatment options, and if necessary, the need for more tests.   Follow these instructions at home: Eating and drinking  Eat a healthy diet that includes fresh fruits and vegetables, whole grains, lean protein, and low-fat dairy products.  Take vitamin and mineral supplements as recommended by your health care provider.  Do not drink alcohol if: ? Your health care provider tells you not to drink. ? You are pregnant, may be pregnant, or are planning to become pregnant.  If you drink alcohol: ? Limit how much you have to 0-1 drink a day. ? Be aware of how much alcohol is in your drink. In the [place] one drink equals one 12 oz bottle of beer (355 mL), one 5 oz glass of wine (148 mL), or one 1 oz glass of hard liquor (44 mL).   Lifestyle  Take daily care of your teeth and gums. Brush your teeth every morning and night with fluoride toothpaste. Floss one time each day.  Stay active. Exercise for at least 30 minutes 5 or more days each week.  Do not use any products that contain nicotine or tobacco, such as cigarettes, e-cigarettes, and chewing tobacco. If you need help quitting, ask your health care provider.  Do not   use drugs.  If you are sexually active, practice safe sex. Use a condom or other form of protection to prevent STIs (sexually transmitted infections).  If you do not wish to become pregnant, use a form of birth control. If you plan to become pregnant, see your health care provider for a prepregnancy visit.  Find healthy ways to cope with stress, such as: ? Meditation, yoga, or listening to music. ? Journaling. ? Talking to a trusted  person. ? Spending time with friends and family. Safety  Always wear your seat belt while driving or riding in a vehicle.  Do not drive: ? If you have been drinking alcohol. Do not ride with someone who has been drinking. ? When you are tired or distracted. ? While texting.  Wear a helmet and other protective equipment during sports activities.  If you have firearms in your house, make sure you follow all gun safety procedures.  Seek help if you have been physically or sexually abused. What's next?  Go to your health care provider once a year for an annual wellness visit.  Ask your health care provider how often you should have your eyes and teeth checked.  Stay up to date on all vaccines. This information is not intended to replace advice given to you by your health care provider. Make sure you discuss any questions you have with your health care provider. Document Revised: 01/24/2020 Document Reviewed: 02/06/2018 Elsevier Patient Education  [place].   Breastfeeding  Choosing to breastfeed is one of the best decisions you can make for yourself and your baby. A change in hormones during pregnancy causes your breasts to make breast milk in your milk-producing glands. Hormones prevent breast milk from being released before your baby is born. They also prompt milk flow after birth. Once breastfeeding has begun, thoughts of your baby, as well as his or her sucking or crying, can stimulate the release of milk from your milk-producing glands. Benefits of breastfeeding Research shows that breastfeeding offers many health benefits for infants and mothers. It also offers a cost-free and convenient way to feed your baby. For your baby  Your first milk (colostrum) helps your baby's digestive system to function better.  Special cells in your milk (antibodies) help your baby to fight off infections.  Breastfed babies are less likely to develop asthma, allergies, obesity, or type  2 diabetes. They are also at lower risk for sudden infant death syndrome (SIDS).  Nutrients in breast milk are better able to meet your baby's needs compared to infant formula.  Breast milk improves your baby's brain development. For you  Breastfeeding helps to create a very special bond between you and your baby.  Breastfeeding is convenient. Breast milk costs nothing and is always available at the correct temperature.  Breastfeeding helps to burn calories. It helps you to lose the weight that you gained during pregnancy.  Breastfeeding makes your uterus return faster to its size before pregnancy. It also slows bleeding (lochia) after you give birth.  Breastfeeding helps to lower your risk of developing type 2 diabetes, osteoporosis, rheumatoid arthritis, cardiovascular disease, and breast, ovarian, uterine, and endometrial cancer later in life. Breastfeeding basics Starting breastfeeding  Find a comfortable place to sit or lie down, with your neck and back well-supported.  Place a pillow or a rolled-up blanket under your baby to bring him or her to the level of your breast (if you are seated). Nursing pillows are specially designed to help support your  arms and your baby while you breastfeed.  Make sure that your baby's tummy (abdomen) is facing your abdomen.  Gently massage your breast. With your fingertips, massage from the outer edges of your breast inward toward the nipple. This encourages milk flow. If your milk flows slowly, you may need to continue this action during the feeding.  Support your breast with 4 fingers underneath and your thumb above your nipple (make the letter "C" with your hand). Make sure your fingers are well away from your nipple and your baby's mouth.  Stroke your baby's lips gently with your finger or nipple.  When your baby's mouth is open wide enough, quickly bring your baby to your breast, placing your entire nipple and as much of the areola as possible  into your baby's mouth. The areola is the colored area around your nipple. ? More areola should be visible above your baby's upper lip than below the lower lip. ? Your baby's lips should be opened and extended outward (flanged) to ensure an adequate, comfortable latch. ? Your baby's tongue should be between his or her lower gum and your breast.  Make sure that your baby's mouth is correctly positioned around your nipple (latched). Your baby's lips should create a seal on your breast and be turned out (everted).  It is common for your baby to suck about 2-3 minutes in order to start the flow of breast milk. Latching Teaching your baby how to latch onto your breast properly is very important. An improper latch can cause nipple pain, decreased milk supply, and poor weight gain in your baby. Also, if your baby is not latched onto your nipple properly, he or she may swallow some air during feeding. This can make your baby fussy. Burping your baby when you switch breasts during the feeding can help to get rid of the air. However, teaching your baby to latch on properly is still the best way to prevent fussiness from swallowing air while breastfeeding. Signs that your baby has successfully latched onto your nipple  Silent tugging or silent sucking, without causing you pain. Infant's lips should be extended outward (flanged).  Swallowing heard between every 3-4 sucks once your milk has started to flow (after your let-down milk reflex occurs).  Muscle movement above and in front of his or her ears while sucking. Signs that your baby has not successfully latched onto your nipple  Sucking sounds or smacking sounds from your baby while breastfeeding.  Nipple pain. If you think your baby has not latched on correctly, slip your finger into the corner of your baby's mouth to break the suction and place it between your baby's gums. Attempt to start breastfeeding again. Signs of successful breastfeeding Signs  from your baby  Your baby will gradually decrease the number of sucks or will completely stop sucking.  Your baby will fall asleep.  Your baby's body will relax.  Your baby will retain a small amount of milk in his or her mouth.  Your baby will let go of your breast by himself or herself. Signs from you  Breasts that have increased in firmness, weight, and size 1-3 hours after feeding.  Breasts that are softer immediately after breastfeeding.  Increased milk volume, as well as a change in milk consistency and color by the fifth day of breastfeeding.  Nipples that are not sore, cracked, or bleeding. Signs that your baby is getting enough milk  Wetting at least 1-2 diapers during the first 24 hours after  birth.  Wetting at least 5-6 diapers every 24 hours for the first week after birth. The urine should be clear or pale yellow by the age of 5 days.  Wetting 6-8 diapers every 24 hours as your baby continues to grow and develop.  At least 3 stools in a 24-hour period by the age of 5 days. The stool should be soft and yellow.  At least 3 stools in a 24-hour period by the age of 7 days. The stool should be seedy and yellow.  No loss of weight greater than 10% of birth weight during the first 3 days of life.  Average weight gain of 4-7 oz (113-198 g) per week after the age of 4 days.  Consistent daily weight gain by the age of 5 days, without weight loss after the age of 2 weeks. After a feeding, your baby may spit up a small amount of milk. This is normal. Breastfeeding frequency and duration Frequent feeding will help you make more milk and can prevent sore nipples and extremely full breasts (breast engorgement). Breastfeed when you feel the need to reduce the fullness of your breasts or when your baby shows signs of hunger. This is called "breastfeeding on demand." Signs that your baby is hungry include:  Increased alertness, activity, or restlessness.  Movement of the head  from side to side.  Opening of the mouth when the corner of the mouth or cheek is stroked (rooting).  Increased sucking sounds, smacking lips, cooing, sighing, or squeaking.  Hand-to-mouth movements and sucking on fingers or hands.  Fussing or crying. Avoid introducing a pacifier to your baby in the first 4-6 weeks after your baby is born. After this time, you may choose to use a pacifier. Research has shown that pacifier use during the first year of a baby's life decreases the risk of sudden infant death syndrome (SIDS). Allow your baby to feed on each breast as long as he or she wants. When your baby unlatches or falls asleep while feeding from the first breast, offer the second breast. Because newborns are often sleepy in the first few weeks of life, you may need to awaken your baby to get him or her to feed. Breastfeeding times will vary from baby to baby. However, the following rules can serve as a guide to help you make sure that your baby is properly fed:  Newborns (babies 101 weeks of age or younger) may breastfeed every 1-3 hours.  Newborns should not go without breastfeeding for longer than 3 hours during the day or 5 hours during the night.  You should breastfeed your baby a minimum of 8 times in a 24-hour period. Breast milk pumping Pumping and storing breast milk allows you to make sure that your baby is exclusively fed your breast milk, even at times when you are unable to breastfeed. This is especially important if you go back to work while you are still breastfeeding, or if you are not able to be present during feedings. Your lactation consultant can help you find a method of pumping that works best for you and give you guidelines about how long it is safe to store breast milk.      Caring for your breasts while you breastfeed Nipples can become dry, cracked, and sore while breastfeeding. The following recommendations can help keep your breasts moisturized and healthy:  Avoid  using soap on your nipples.  Wear a supportive bra designed especially for nursing. Avoid wearing underwire-style bras or extremely  tight bras (sports bras).  Air-dry your nipples for 3-4 minutes after each feeding.  Use only cotton bra pads to absorb leaked breast milk. Leaking of breast milk between feedings is normal.  Use lanolin on your nipples after breastfeeding. Lanolin helps to maintain your skin's normal moisture barrier. Pure lanolin is not harmful (not toxic) to your baby. You may also hand express a few drops of breast milk and gently massage that milk into your nipples and allow the milk to air-dry. In the first few weeks after giving birth, some women experience breast engorgement. Engorgement can make your breasts feel heavy, warm, and tender to the touch. Engorgement peaks within 3-5 days after you give birth. The following recommendations can help to ease engorgement:  Completely empty your breasts while breastfeeding or pumping. You may want to start by applying warm, moist heat (in the shower or with warm, water-soaked hand towels) just before feeding or pumping. This increases circulation and helps the milk flow. If your baby does not completely empty your breasts while breastfeeding, pump any extra milk after he or she is finished.  Apply ice packs to your breasts immediately after breastfeeding or pumping, unless this is too uncomfortable for you. To do this: ? Put ice in a plastic bag. ? Place a towel between your skin and the bag. ? Leave the ice on for 20 minutes, 2-3 times a day.  Make sure that your baby is latched on and positioned properly while breastfeeding. If engorgement persists after 48 hours of following these recommendations, contact your health care provider or a Science writer. Overall health care recommendations while breastfeeding  Eat 3 healthy meals and 3 snacks every day. Well-nourished mothers who are breastfeeding need an additional 450-500  calories a day. You can meet this requirement by increasing the amount of a balanced diet that you eat.  Drink enough water to keep your urine pale yellow or clear.  Rest often, relax, and continue to take your prenatal vitamins to prevent fatigue, stress, and low vitamin and mineral levels in your body (nutrient deficiencies).  Do not use any products that contain nicotine or tobacco, such as cigarettes and e-cigarettes. Your baby may be harmed by chemicals from cigarettes that pass into breast milk and exposure to secondhand smoke. If you need help quitting, ask your health care provider.  Avoid alcohol.  Do not use illegal drugs or marijuana.  Talk with your health care provider before taking any medicines. These include over-the-counter and prescription medicines as well as vitamins and herbal supplements. Some medicines that may be harmful to your baby can pass through breast milk.  It is possible to become pregnant while breastfeeding. If birth control is desired, ask your health care provider about options that will be safe while breastfeeding your baby. Where to find more information: [employer]: www.llli.org Contact a health care provider if:  You feel like you want to stop breastfeeding or have become frustrated with breastfeeding.  Your nipples are cracked or bleeding.  Your breasts are red, tender, or warm.  You have: ? Painful breasts or nipples. ? A swollen area on either breast. ? A fever or chills. ? Nausea or vomiting. ? Drainage other than breast milk from your nipples.  Your breasts do not become full before feedings by the fifth day after you give birth.  You feel sad and depressed.  Your baby is: ? Too sleepy to eat well. ? Having trouble sleeping. ? More than 1  week old and wetting fewer than 6 diapers in a 24-hour period. ? Not gaining weight by 74 days of age.  Your baby has fewer than 3 stools in a 24-hour period.  Your baby's  skin or the white parts of his or her eyes become yellow. Get help right away if:  Your baby is overly tired (lethargic) and does not want to wake up and feed.  Your baby develops an unexplained fever. Summary  Breastfeeding offers many health benefits for infant and mothers.  Try to breastfeed your infant when he or she shows early signs of hunger.  Gently tickle or stroke your baby's lips with your finger or nipple to allow the baby to open his or her mouth. Bring the baby to your breast. Make sure that much of the areola is in your baby's mouth. Offer one side and burp the baby before you offer the other side.  Talk with your health care provider or lactation consultant if you have questions or you face problems as you breastfeed. This information is not intended to replace advice given to you by your health care provider. Make sure you discuss any questions you have with your health care provider. Document Revised: 08/22/2017 Document Reviewed: 06/29/2016 Elsevier Patient Education  2021 [employer].

## 2020-09-14 ENCOUNTER — Encounter: Payer: Self-pay | Admitting: Obstetrics and Gynecology

## 2020-09-14 ENCOUNTER — Ambulatory Visit (INDEPENDENT_AMBULATORY_CARE_PROVIDER_SITE_OTHER): Payer: 59 | Admitting: Obstetrics and Gynecology

## 2020-09-14 ENCOUNTER — Other Ambulatory Visit: Payer: Self-pay

## 2020-09-14 VITALS — BP 111/69 | HR 82 | Ht 62.0 in | Wt 130.1 lb

## 2020-09-14 DIAGNOSIS — Z01419 Encounter for gynecological examination (general) (routine) without abnormal findings: Secondary | ICD-10-CM

## 2020-09-14 NOTE — Progress Notes (Signed)
GYNECOLOGY CLINIC ENCOUNTER NOTE Subjective:     Peggy Roy is a 25 y.o. G62P0101 female here for routine annual exam. Current complaints: none  The patient is sexually active, same partner. The patient wears seatbelts: yes. The patient participates in regular exercise: yes, 3-4 times weekly. Has the patient ever been transfused or tattooed?: no. The patient reports that there is not domestic violence in her life.   Travels once a month for a week to [place]. Otherwise working from home.   Gynecologic History No LMP recorded. (Menstrual status: IUD). Reports occasional spotting.  Contraception: Mirena IUD. Inserted 07/2017.  Last Pap: 09/08/2019. Results were: normal.    Obstetric History OB History  Gravida Para Term Preterm AB Living  1 1   1   1   SAB IAB Ectopic Multiple Live Births          1    # Outcome Date GA Lbr Len/2nd Weight Sex Delivery Anes PTL Lv  1 Preterm 04/19/13 [redacted]w[redacted]d  5 lb 3.3 oz (2.36 kg) F CS-LTranv Spinal  LIV    Past Medical History:  Diagnosis Date  . Asthma    activity induced  . 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 Exercise induced bronchospasm   . Headache(784.0)    migraines/tension  . Vision abnormalities    myopia/astigmatism    Family History  Problem Relation Age of Onset  . Asthma Brother   . Miscarriages / Stillbirths Sister   . Arthritis Maternal Grandmother   . Cancer Maternal Grandmother        breast  . Diabetes Maternal Grandmother   . Cancer Maternal Grandfather        prostate  . Diabetes Maternal Grandfather   . Dementia Maternal Grandfather   . Cancer Paternal Grandmother        bladder  . Depression Paternal Grandmother   . Healthy Mother   . Healthy Father   . Alcohol abuse Neg Hx   . Birth defects Neg Hx   . COPD Neg Hx   . Drug abuse Neg Hx   . Early death Neg Hx   . Hearing loss Neg Hx   . Heart disease Neg Hx   . Hyperlipidemia Neg Hx   . Hypertension Neg Hx   . Kidney disease Neg Hx   . Learning  disabilities Neg Hx   . Mental illness Neg Hx   . Mental retardation Neg Hx   . Stroke Neg Hx   . Vision loss Neg Hx   . Varicose Veins Neg Hx     Past Surgical History:  Procedure Laterality Date  . CESAREAN SECTION N/A 04/19/2013   Procedure: CESAREAN SECTION;  Surgeon: 13/02/2013, MD;  Location: [facility];  Service: Obstetrics;  Laterality: N/A;  . LIPOMA EXCISION Right 01/23/2014  . NO PAST SURGERIES      Social History   Socioeconomic History  . Marital status: Single    Spouse name: Not on file  . Number of children: 1  . Years of education: Not on file  . Highest education level: Not on file  Occupational History  . Not on file  Tobacco Use  . Smoking status: Never Smoker  . Smokeless tobacco: Never Used  Vaping Use  . Vaping Use: Never used  Substance and Sexual Activity  . Alcohol use: Yes    Comment: occas  . Drug use: No  . Sexual activity: Yes    Birth control/protection: Condom, I.U.D.  Other Topics Concern  . Not on file  Social History Narrative   Beginning freshman year at [employer]   Has a 15mo daughter who will staying with Luv's mom while she is at school   Social Determinants of Health   Financial Resource Strain: Not on file  Food Insecurity: Not on file  Transportation Needs: Not on file  Physical Activity: Not on file  Stress: Not on file  Social Connections: Not on file  Intimate Partner Violence: Not on file    Current Outpatient Medications on File Prior to Visit  Medication Sig Dispense Refill  . acetaminophen (TYLENOL) 500 MG tablet Take 500 mg by mouth once.    . Levonorgestrel (MIRENA, 52 MG, IU) 1 Device by Intrauterine route.    . valACYclovir (VALTREX) 1000 MG tablet TAKE 1 TABLET (1,000 MG TOTAL) BY MOUTH DAILY. TAKE 1 TAB FOR 5 DAYS FOR ACTIVE OUTBREAK. 30 tablet 1   No current facility-administered medications on file prior to visit.    No Known Allergies   Review of Systems Constitutional: negative for  chills, fatigue, fevers and sweats Eyes: negative for irritation, redness and visual disturbance Ears, nose, mouth, throat, and face: negative for hearing loss, nasal congestion, snoring and tinnitus Respiratory: negative for asthma, cough, sputum Cardiovascular: negative for chest pain, dyspnea, exertional chest pressure/discomfort, irregular heart beat, palpitations and syncope Gastrointestinal: negative for abdominal pain, change in bowel habits, nausea and vomiting Genitourinary: Negative for genital lesions, sexual problems, vaginal dischargedysuria and urinary incontinence Integument/breast: negative for breast lump, breast tenderness and nipple discharge Hematologic/lymphatic: negative for bleeding and easy bruising Musculoskeletal:negative for back pain and muscle weakness Neurological: negative for dizziness, headaches, vertigo and weakness Endocrine: negative for diabetic symptoms including polydipsia, polyuria and skin dryness Allergic/Immunologic: negative for hay fever and urticaria.    Objective:    BP 111/69   Pulse 82   Ht 5\' 2"  (1.575 m)   Wt 130 lb 1.6 oz (59 kg)   BMI 23.80 kg/m   General Appearance:    Alert, cooperative, no distress, appears stated age  Head:    Normocephalic, without obvious abnormality, atraumatic  Eyes:    PERRL, conjunctiva/corneas clear, EOM's intact,, both eyes  Ears:    Normal external ear canals, both ears  Nose:   Nares normal, septum midline, mucosa normal, no drainage    or sinus tenderness  Throat:   Lips, mucosa, and tongue normal; teeth and gums normal  Neck:   Supple, symmetrical, trachea midline, no adenopathy;    thyroid:  no enlargement/tenderness/nodules; no carotid   bruit or JVD  Back:     Symmetric, no curvature, ROM normal, no CVA tenderness  Lungs:     Clear to auscultation bilaterally, respirations unlabored  Chest Wall:    No tenderness or deformity   Heart:    Regular rate and rhythm, S1 and S2 normal, no murmur, rub    or gallop  Breast Exam:    No tenderness, masses, or nipple abnormality  Abdomen:     Soft, non-tender, bowel sounds active all four quadrants,    no masses, no organomegaly  Genitalia:    Normal female exam without lesion, discharge or tenderness.  IUD threads visible, 2 cm in length.   Rectal:    Not performed.   Extremities:   Extremities normal, atraumatic, no cyanosis or edema  Pulses:   2+ and symmetric all extremities  Skin:   Skin color, texture, turgor normal, no rashes or lesions  Lymph nodes:   Cervical, supraclavicular, and axillary nodes normal  Neurologic:   CNII-XII intact, normal strength, sensation and reflexes    throughout    Assessment:   Healthy female exam. IUDin place  Plan:   - Labs: will hold off this year as labs are usually normal. Has no issues.  For annual labs next year - Pap smear up to date. Continue q 3 year screens.  - Education reviewed: safe sex/STD prevention, self breast exams and weight bearing exercise.Declines STC screening.  - Contraception: IUD in place. Can utilize for up to 7 years.  - Declined flu vaccine.  - Has completed COVID vaccination series. Does not think she will do the booster at this time.  - F/u in 1 year for annual exam.     Hildred Laser, MD [facility]

## 2020-09-14 NOTE — Progress Notes (Signed)
Annual Exam-pt stated that she was doing well no problems.

## 2021-10-04 ENCOUNTER — Telehealth: Payer: Self-pay | Admitting: Obstetrics and Gynecology

## 2021-10-04 NOTE — Telephone Encounter (Signed)
Pt called stating that she lost her medication while traveling, sh is requesting a refill for valtrex [facility] [place].  ?

## 2021-10-06 MED ORDER — VALACYCLOVIR HCL 1 G PO TABS: 1000.0000 mg | ORAL_TABLET | Freq: Every day | ORAL | 1 refills | Status: DC

## 2021-11-03 ENCOUNTER — Encounter: Payer: 59 | Admitting: Obstetrics and Gynecology

## 2021-11-09 ENCOUNTER — Encounter: Payer: 59 | Admitting: Obstetrics and Gynecology

## 2021-11-13 ENCOUNTER — Other Ambulatory Visit: Payer: Self-pay

## 2021-11-13 ENCOUNTER — Telehealth: Payer: Self-pay | Admitting: Obstetrics and Gynecology

## 2021-11-13 MED ORDER — VALACYCLOVIR HCL 1 G PO TABS: 1000.0000 mg | ORAL_TABLET | Freq: Every day | ORAL | 0 refills | Status: DC

## 2021-11-13 NOTE — Telephone Encounter (Signed)
Pt needs RX  [facility] [place]  [address] [place] [address] Pharmacy Phone number # [phone]. Pt is out of town and left pills at home on accident. Please send message via MY chart when sent . Pt is unable to answer phone on flight

## 2022-01-16 NOTE — Progress Notes (Deleted)
GYNECOLOGY ANNUAL PHYSICAL EXAM PROGRESS NOTE  Subjective:    Peggy Roy is a 26 y.o. G78P0101 female who presents for an annual exam. The patient has no complaints today. The patient is sexually active. The patient participates in regular exercise: yes. Has the patient ever been transfused or tattooed?: no. The patient reports that there is not domestic violence in her life.    Menstrual History: Menarche age: *** No LMP recorded. (Menstrual status: IUD).     Gynecologic History:  Contraception: IUD History of STI's:  Last Pap: 09/08/2019. Results were: normal.  Denies h/o abnormal pap smears.   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   Exercise induced bronchospasm    Headache(784.0)    migraines/tension   Vision abnormalities    myopia/astigmatism    Past Surgical History:  Procedure Laterality Date   CESAREAN SECTION N/A 04/19/2013   Procedure: CESAREAN SECTION;  Surgeon: Leslie Andrea, MD;  Location: [facility];  Service: Obstetrics;  Laterality: N/A;   LIPOMA EXCISION Right 01/23/2014   NO PAST SURGERIES      Family History  Problem Relation Age of Onset   Asthma Brother    Miscarriages / [place] Sister    Arthritis Maternal Grandmother    Cancer Maternal Grandmother        breast   Diabetes Maternal Grandmother    Cancer Maternal Grandfather        prostate   Diabetes Maternal Grandfather    Dementia Maternal Grandfather    Cancer Paternal Grandmother        bladder   Depression Paternal Grandmother    Healthy Mother    Healthy Father    Alcohol abuse Neg Hx     Birth defects Neg Hx    COPD Neg Hx    Drug abuse Neg Hx    Early death Neg Hx    Hearing loss Neg Hx    Heart disease Neg Hx    Hyperlipidemia Neg Hx    Hypertension Neg Hx    Kidney disease Neg Hx    Learning disabilities Neg Hx    Mental illness Neg Hx    Mental retardation Neg Hx    Stroke Neg Hx    Vision loss Neg Hx    Varicose Veins Neg Hx     Social History   Socioeconomic History   Marital status: Single    Spouse name: Not on file   Number of children: 1   Years of education: Not on file   Highest education level: Not on file  Occupational History   Not on file  Tobacco Use   Smoking status: Never   Smokeless tobacco: Never  Vaping Use   Vaping Use: Never used  Substance and Sexual Activity   Alcohol use: Yes    Comment: occas   Drug use: No   Sexual activity: Yes    Birth control/protection: Condom, I.U.D.  Other Topics  Concern   Not on file  Social History Narrative   Beginning freshman year at [employer]   Has a 62mo daughter who will staying with Kitana's mom while she is at school   Social Determinants of Health   Financial Resource Strain: Not on file  Food Insecurity: Not on file  Transportation Needs: Not on file  Physical Activity: Not on file  Stress: Not on file  Social Connections: Not on file  Intimate Partner Violence: Not on file    Current Outpatient Medications on File Prior to Visit  Medication Sig Dispense Refill   acetaminophen (TYLENOL) 500 MG tablet Take 500 mg by mouth once.     Levonorgestrel (MIRENA, 52 MG, IU) 1 Device by Intrauterine route.     valACYclovir (VALTREX) 1000 MG tablet Take 1 tablet (1,000 mg total) by mouth daily. Take 1 tab for 5 days for active outbreak. 30 tablet 0   No current facility-administered medications on file prior to visit.    No Known Allergies   Review of Systems Constitutional: negative for chills, fatigue, fevers and sweats Eyes: negative for irritation, redness and visual  disturbance Ears, nose, mouth, throat, and face: negative for hearing loss, nasal congestion, snoring and tinnitus Respiratory: negative for asthma, cough, sputum Cardiovascular: negative for chest pain, dyspnea, exertional chest pressure/discomfort, irregular heart beat, palpitations and syncope Gastrointestinal: negative for abdominal pain, change in bowel habits, nausea and vomiting Genitourinary: negative for abnormal menstrual periods, genital lesions, sexual problems and vaginal discharge, dysuria and urinary incontinence Integument/breast: negative for breast lump, breast tenderness and nipple discharge Hematologic/lymphatic: negative for bleeding and easy bruising Musculoskeletal:negative for back pain and muscle weakness Neurological: negative for dizziness, headaches, vertigo and weakness Endocrine: negative for diabetic symptoms including polydipsia, polyuria and skin dryness Allergic/Immunologic: negative for hay fever and urticaria      Objective:  currently breastfeeding. There is no height or weight on file to calculate BMI.    General Appearance:    Alert, cooperative, no distress, appears stated age  Head:    Normocephalic, without obvious abnormality, atraumatic  Eyes:    PERRL, conjunctiva/corneas clear, EOM's intact, both eyes  Ears:    Normal external ear canals, both ears  Nose:   Nares normal, septum midline, mucosa normal, no drainage or sinus tenderness  Throat:   Lips, mucosa, and tongue normal; teeth and gums normal  Neck:   Supple, symmetrical, trachea midline, no adenopathy; thyroid: no enlargement/tenderness/nodules; no carotid bruit or JVD  Back:     Symmetric, no curvature, ROM normal, no CVA tenderness  Lungs:     Clear to auscultation bilaterally, respirations unlabored  Chest Wall:    No tenderness or deformity   Heart:    Regular rate and rhythm, S1 and S2 normal, no murmur, rub or gallop  Breast Exam:    No tenderness, masses, or nipple abnormality   Abdomen:     Soft, non-tender, bowel sounds active all four quadrants, no masses, no organomegaly.    Genitalia:    Pelvic:external genitalia normal, vagina without lesions, discharge, or tenderness, rectovaginal septum  normal. Cervix normal in appearance, no cervical motion tenderness, no adnexal masses or tenderness.  Uterus normal size, shape, mobile, regular contours, nontender.  Rectal:    Normal external sphincter.  No hemorrhoids appreciated. Internal exam not done.   Extremities:   Extremities normal, atraumatic, no cyanosis or edema  Pulses:   2+ and symmetric all extremities  Skin:   Skin color, texture, turgor normal,  no rashes or lesions  Lymph nodes:   Cervical, supraclavicular, and axillary nodes normal  Neurologic:   CNII-XII intact, normal strength, sensation and reflexes throughout   .  Labs:  Lab Results  Component Value Date   WBC 4.8 09/08/2019   HGB 13.7 09/08/2019   HCT 39.9 09/08/2019   MCV 99 (H) 09/08/2019   PLT 281 09/08/2019    Lab Results  Component Value Date   CREATININE 0.96 09/08/2019   BUN 15 09/08/2019   NA 139 09/08/2019   K 4.3 09/08/2019   CL 103 09/08/2019   CO2 26 09/08/2019    Lab Results  Component Value Date   ALT 22 09/08/2019   AST 20 09/08/2019   ALKPHOS 96 09/08/2019   BILITOT 0.3 09/08/2019    No results found for: "TSH"   Assessment:   No diagnosis found.   Plan:  Blood tests: {blood tests:13147}. Breast self exam technique reviewed and patient encouraged to perform self-exam monthly. Contraception: IUD. Discussed healthy lifestyle modifications. Mammogram  : Not age appropriate Pap smear  UTD . COVID vaccination status: Follow up in 1 year for annual exam   Hildred Laser, MD [facility]

## 2022-01-17 ENCOUNTER — Encounter: Payer: 59 | Admitting: Obstetrics and Gynecology

## 2022-05-16 ENCOUNTER — Ambulatory Visit (INDEPENDENT_AMBULATORY_CARE_PROVIDER_SITE_OTHER): Payer: 59 | Admitting: Obstetrics and Gynecology

## 2022-05-16 ENCOUNTER — Other Ambulatory Visit (HOSPITAL_COMMUNITY)
Admission: RE | Admit: 2022-05-16 | Discharge: 2022-05-16 | Disposition: A | Discharge status: Home / Self Care | Payer: 59 | Source: Ambulatory Visit | Attending: Obstetrics and Gynecology | Admitting: Obstetrics and Gynecology

## 2022-05-16 ENCOUNTER — Encounter: Payer: Self-pay | Admitting: Obstetrics and Gynecology

## 2022-05-16 VITALS — BP 113/73 | HR 77 | Resp 16 | Ht 62.0 in | Wt 136.3 lb

## 2022-05-16 DIAGNOSIS — Z01419 Encounter for gynecological examination (general) (routine) without abnormal findings: Secondary | ICD-10-CM

## 2022-05-16 DIAGNOSIS — Z113 Encounter for screening for infections with a predominantly sexual mode of transmission: Secondary | ICD-10-CM

## 2022-05-16 DIAGNOSIS — Z975 Presence of (intrauterine) contraceptive device: Secondary | ICD-10-CM

## 2022-05-16 DIAGNOSIS — Z23 Encounter for immunization: Secondary | ICD-10-CM

## 2022-05-16 LAB — URINE CYTOLOGY ANCILLARY ONLY
Candida Glabrata: NEGATIVE
Candida Vaginitis: NEGATIVE
Chlamydia: NEGATIVE
Comment: NEGATIVE
Comment: NEGATIVE
Comment: NEGATIVE
Comment: NEGATIVE
Comment: NORMAL
Neisseria Gonorrhea: NEGATIVE
Trichomonas: NEGATIVE

## 2022-05-16 NOTE — Progress Notes (Signed)
GYNECOLOGY CLINIC ENCOUNTER NOTE Subjective:     Peggy Roy is a 26 y.o. G59P0101 female here for routine annual exam. Current complaints: none  The patient is sexually active, same partner. The patient wears seatbelts: yes. The patient participates in regular exercise: yes. Has the patient ever been transfused or tattooed?: no. The patient reports that there is not domestic violence in her life.   Still  traveling once a month for a week to [place] for her job. Otherwise working from home.   Gynecologic History No LMP recorded. (Menstrual status: IUD).  Contraception: Mirena IUD. Inserted 07/2017.  Last Pap: 09/08/2019. Results were: normal.    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   Exercise induced bronchospasm    Headache(784.0)    migraines/tension   Vision abnormalities    myopia/astigmatism    Family History  Problem Relation Age of Onset   Asthma Brother    Miscarriages / Stillbirths Sister    Arthritis Maternal Grandmother    Cancer Maternal Grandmother        breast   Diabetes Maternal Grandmother    Cancer Maternal Grandfather        prostate   Diabetes Maternal Grandfather    Dementia Maternal Grandfather    Cancer Paternal Grandmother        bladder   Depression Paternal Grandmother    Healthy Mother    Healthy Father    Alcohol abuse Neg Hx    Birth defects Neg Hx    COPD Neg Hx    Drug abuse Neg Hx    Early death Neg Hx    Hearing loss Neg Hx    Heart disease Neg Hx    Hyperlipidemia Neg Hx    Hypertension Neg Hx    Kidney disease Neg Hx    Learning disabilities Neg Hx    Mental illness Neg Hx     Mental retardation Neg Hx    Stroke Neg Hx    Vision loss Neg Hx    Varicose Veins Neg Hx     Past Surgical History:  Procedure Laterality Date   CESAREAN SECTION N/A 04/19/2013   Procedure: CESAREAN SECTION;  Surgeon: 13/02/2013, MD;  Location: [facility];  Service: Obstetrics;  Laterality: N/A;   LIPOMA EXCISION Right 01/23/2014   NO PAST SURGERIES      Social History   Socioeconomic History   Marital status: Single    Spouse name: Not on file   Number of children: 1   Years of education: Not on file   Highest education level: Not on file  Occupational History   Not on file  Tobacco Use   Smoking status: Never   Smokeless tobacco: Never  Vaping Use   Vaping Use: Never used  Substance and Sexual Activity   Alcohol use: Yes    Comment: occas   Drug use: No   Sexual activity: Yes    Birth control/protection: Condom, I.U.D.  Other Topics Concern  Not on file  Social History Narrative   Beginning freshman year at [employer]   Has a 72mo daughter who will staying with Meela's mom while she is at school   Social Determinants of Health   Financial Resource Strain: Not on file  Food Insecurity: Not on file  Transportation Needs: Not on file  Physical Activity: Not on file  Stress: Not on file  Social Connections: Not on file  Intimate Partner Violence: Not on file    Current Outpatient Medications on File Prior to Visit  Medication Sig Dispense Refill   acetaminophen (TYLENOL) 500 MG tablet Take 500 mg by mouth once.     Levonorgestrel (MIRENA, 52 MG, IU) 1 Device by Intrauterine route.     valACYclovir (VALTREX) 1000 MG tablet Take 1 tablet (1,000 mg total) by mouth daily. Take 1 tab for 5 days for active outbreak. 30 tablet 0   No current facility-administered medications on file prior to visit.    No Known Allergies   Review of Systems Constitutional: negative for chills, fatigue, fevers and sweats Eyes: negative for irritation, redness and visual  disturbance Ears, nose, mouth, throat, and face: negative for hearing loss, nasal congestion, snoring and tinnitus Respiratory: negative for asthma, cough, sputum Cardiovascular: negative for chest pain, dyspnea, exertional chest pressure/discomfort, irregular heart beat, palpitations and syncope Gastrointestinal: negative for abdominal pain, change in bowel habits, nausea and vomiting Genitourinary: Negative for genital lesions, sexual problems, vaginal dischargedysuria and urinary incontinence Integument/breast: negative for breast lump, breast tenderness and nipple discharge Hematologic/lymphatic: negative for bleeding and easy bruising Musculoskeletal:negative for back pain and muscle weakness Neurological: negative for dizziness, headaches, vertigo and weakness Endocrine: negative for diabetic symptoms including polydipsia, polyuria and skin dryness Allergic/Immunologic: negative for hay fever and urticaria.    Objective:    BP 113/73   Pulse 77   Resp 16   Ht 5\' 2"  (1.575 m)   Wt 136 lb 4.8 oz (61.8 kg)   Breastfeeding No   BMI 24.93 kg/m   General Appearance:    Alert, cooperative, no distress, appears stated age  Head:    Normocephalic, without obvious abnormality, atraumatic  Eyes:    PERRL, conjunctiva/corneas clear, EOM's intact,, both eyes  Ears:    Normal external ear canals, both ears  Nose:   Nares normal, septum midline, mucosa normal, no drainage    or sinus tenderness  Throat:   Lips, mucosa, and tongue normal; teeth and gums normal  Neck:   Supple, symmetrical, trachea midline, no adenopathy;    thyroid:  no enlargement/tenderness/nodules; no carotid   bruit or JVD  Back:     Symmetric, no curvature, ROM normal, no CVA tenderness  Lungs:     Clear to auscultation bilaterally, respirations unlabored  Chest Wall:    No tenderness or deformity   Heart:    Regular rate and rhythm, S1 and S2 normal, no murmur, rub   or gallop  Breast Exam:    No tenderness, masses,  or nipple abnormality  Abdomen:     Soft, non-tender, bowel sounds active all four quadrants,    no masses, no organomegaly  Genitalia:    Normal female exam without lesion, discharge or tenderness.  IUD threads visible, 2 cm in length.   Rectal:    Not performed.   Extremities:   Extremities normal, atraumatic, no cyanosis or edema  Pulses:   2+ and symmetric all extremities  Skin:   Skin color, texture, turgor normal, no rashes  or lesions  Lymph nodes:   Cervical, supraclavicular, and axillary nodes normal  Neurologic:   CNII-XII intact, normal strength, sensation and reflexes    throughout    Assessment:   Healthy female exam. IUDin place STD screening  Plan:   - Labs: see orders - Pap smear up to date. Continue q 3 year screens. Due in 1 year - Education reviewed: safe sex/STD prevention, self breast exams and weight bearing exercise. Requests STD screening today, ordered.  - Contraception: IUD in place. Can utilize for up to 8 years.  - Declined flu vaccine.  -Ok to receive Tdap today.  - F/u in 1 year for annual exam.     Hildred Laser, MD [facility]

## 2022-05-17 LAB — COMPREHENSIVE METABOLIC PANEL
ALT: 12 IU/L (ref 0–32)
AST: 16 IU/L (ref 0–40)
Albumin/Globulin Ratio: 1.5 (ref 1.2–2.2)
Albumin: 4.3 g/dL (ref 4.0–5.0)
Alkaline Phosphatase: 73 IU/L (ref 44–121)
BUN/Creatinine Ratio: 12 (ref 9–23)
BUN: 12 mg/dL (ref 6–20)
Bilirubin Total: 0.5 mg/dL (ref 0.0–1.2)
CO2: 24 mmol/L (ref 20–29)
Calcium: 10.1 mg/dL (ref 8.7–10.2)
Chloride: 105 mmol/L (ref 96–106)
Creatinine, Ser: 1 mg/dL (ref 0.57–1.00)
Globulin, Total: 2.8 g/dL (ref 1.5–4.5)
Glucose: 62 mg/dL — ABNORMAL LOW (ref 70–99)
Potassium: 4.5 mmol/L (ref 3.5–5.2)
Sodium: 143 mmol/L (ref 134–144)
Total Protein: 7.1 g/dL (ref 6.0–8.5)
eGFR: 79 mL/min/{1.73_m2} (ref >59)

## 2022-05-17 LAB — TSH: TSH: 0.749 u[IU]/mL (ref 0.450–4.500)

## 2022-05-17 LAB — HEPATITIS C ANTIBODY: Hep C Virus Ab: NONREACTIVE

## 2022-05-17 LAB — CBC
Hematocrit: 42.5 % (ref 34.0–46.6)
Hemoglobin: 14.6 g/dL (ref 11.1–15.9)
MCH: 33.6 pg — ABNORMAL HIGH (ref 26.6–33.0)
MCHC: 34.4 g/dL (ref 31.5–35.7)
MCV: 98 fL — ABNORMAL HIGH (ref 79–97)
Platelets: 277 10*3/uL (ref 150–450)
RBC: 4.34 x10E6/uL (ref 3.77–5.28)
RDW: 11.8 % (ref 11.7–15.4)
WBC: 2.9 10*3/uL — ABNORMAL LOW (ref 3.4–10.8)

## 2022-05-17 LAB — RPR: RPR Ser Ql: NONREACTIVE

## 2022-05-17 LAB — HIV ANTIBODY (ROUTINE TESTING W REFLEX): HIV Screen 4th Generation wRfx: NONREACTIVE

## 2022-05-17 LAB — HEPATITIS B SURFACE ANTIGEN: Hepatitis B Surface Ag: NEGATIVE

## 2022-11-26 ENCOUNTER — Telehealth: Payer: Self-pay

## 2022-11-26 MED ORDER — VALACYCLOVIR HCL 1 G PO TABS: 1000.0000 mg | ORAL_TABLET | Freq: Every day | ORAL | 2 refills | Status: AC

## 2023-07-25 NOTE — Patient Instructions (Signed)
Preventive Care 55-29 Years Old, Female Preventive care refers to lifestyle choices and visits with your health care provider that can promote health and wellness. Preventive care visits are also called wellness exams. What can I expect for my preventive care visit? Counseling During your preventive care visit, your health care provider may ask about your: Medical history, including: Past medical problems. Family medical history. Pregnancy history. Current health, including: Menstrual cycle. Method of birth control. Emotional well-being. Home life and relationship well-being. Sexual activity and sexual health. Lifestyle, including: Alcohol, nicotine or tobacco, and drug use. Access to firearms. Diet, exercise, and sleep habits. Work and work Astronomer. Sunscreen use. Safety issues such as seatbelt and bike helmet use. Physical exam Your health care provider may check your: Height and weight. These may be used to calculate your BMI (body mass index). BMI is a measurement that tells if you are at a healthy weight. Waist circumference. This measures the distance around your waistline. This measurement also tells if you are at a healthy weight and may help predict your risk of certain diseases, such as type 2 diabetes and high blood pressure. Heart rate and blood pressure. Body temperature. Skin for abnormal spots. What immunizations do I need?  Vaccines are usually given at various ages, according to a schedule. Your health care provider will recommend vaccines for you based on your age, medical history, and lifestyle or other factors, such as travel or where you work. What tests do I need? Screening Your health care provider may recommend screening tests for certain conditions. This may include: Pelvic exam and Pap test. Lipid and cholesterol levels. Diabetes screening. This is done by checking your blood sugar (glucose) after you have not eaten for a while (fasting). Hepatitis  B test. Hepatitis C test. HIV (human immunodeficiency virus) test. STI (sexually transmitted infection) testing, if you are at risk. BRCA-related cancer screening. This may be done if you have a family history of breast, ovarian, tubal, or peritoneal cancers. Talk with your health care provider about your test results, treatment options, and if necessary, the need for more tests. Follow these instructions at home: Eating and drinking  Eat a healthy diet that includes fresh fruits and vegetables, whole grains, lean protein, and low-fat dairy products. Take vitamin and mineral supplements as recommended by your health care provider. Do not drink alcohol if: Your health care provider tells you not to drink. You are pregnant, may be pregnant, or are planning to become pregnant. If you drink alcohol: Limit how much you have to 0-1 drink a day. Know how much alcohol is in your drink. In the [place] one drink equals one 12 oz bottle of beer (355 mL), one 5 oz glass of wine (148 mL), or one 1 oz glass of hard liquor (44 mL). Lifestyle Brush your teeth every morning and night with fluoride toothpaste. Floss one time each day. Exercise for at least 30 minutes 5 or more days each week. Do not use any products that contain nicotine or tobacco. These products include cigarettes, chewing tobacco, and vaping devices, such as e-cigarettes. If you need help quitting, ask your health care provider. Do not use drugs. If you are sexually active, practice safe sex. Use a condom or other form of protection to prevent STIs. If you do not wish to become pregnant, use a form of birth control. If you plan to become pregnant, see your health care provider for a prepregnancy visit. Find healthy ways to manage stress, such as: Meditation,  yoga, or listening to music. Journaling. Talking to a trusted person. Spending time with friends and family. Minimize exposure to UV radiation to reduce your risk of skin  cancer. Safety Always wear your seat belt while driving or riding in a vehicle. Do not drive: If you have been drinking alcohol. Do not ride with someone who has been drinking. If you have been using any mind-altering substances or drugs. While texting. When you are tired or distracted. Wear a helmet and other protective equipment during sports activities. If you have firearms in your house, make sure you follow all gun safety procedures. Seek help if you have been physically or sexually abused. What's next? Go to your health care provider once a year for an annual wellness visit. Ask your health care provider how often you should have your eyes and teeth checked. Stay up to date on all vaccines. This information is not intended to replace advice given to you by your health care provider. Make sure you discuss any questions you have with your health care provider. Document Revised: 11/23/2020 Document Reviewed: 11/23/2020 Elsevier Patient Education  2024 [employer] Breast Self-Awareness Breast self-awareness is knowing how your breasts look and feel. You need to: Check your breasts on a regular basis. Tell your doctor about any changes. Become familiar with the look and feel of your breasts. This can help you catch a breast problem while it is still small and can be treated. You should do breast self-exams even if you have breast implants. What you need: A mirror. A well-lit room. A pillow or other soft object. How to do a breast self-exam Follow these steps to do a breast self-exam: Look for changes  Take off all the clothes above your waist. Stand in front of a mirror in a room with good lighting. Put your hands down at your sides. Compare your breasts in the mirror. Look for any difference between them, such as: A difference in shape. A difference in size. Wrinkles, dips, and bumps in one breast and not the other. Look at each breast for changes in the skin, such  as: Redness. Scaly areas. Skin that has gotten thicker. Dimpling. Open sores (ulcers). Look for changes in your nipples, such as: Fluid coming out of a nipple. Fluid around a nipple. Bleeding. Dimpling. Redness. A nipple that looks pushed in (retracted), or that has changed position. Feel for changes Lie on your back. Feel each breast. To do this: Pick a breast to feel. Place a pillow under the shoulder closest to that breast. Put the arm closest to that breast behind your head. Feel the nipple area of that breast using the hand of your other arm. Feel the area with the pads of your three middle fingers by making small circles with your fingers. Use light, medium, and firm pressure. Continue the overlapping circles, moving downward over the breast. Keep making circles with your fingers. Stop when you feel your ribs. Start making circles with your fingers again, this time going upward until you reach your collarbone. Then, make circles outward across your breast and into your armpit area. Squeeze your nipple. Check for discharge and lumps. Repeat these steps to check your other breast. Sit or stand in the tub or shower. With soapy water on your skin, feel each breast the same way you did when you were lying down. Write down what you find Writing down what you find can help you remember what to tell your doctor. Write down: What is  normal for each breast. Any changes you find in each breast. These include: The kind of changes you find. A tender or painful breast. Any lump you find. Write down its size and where it is. When you last had your monthly period (menstrual cycle). General tips If you are breastfeeding, the best time to check your breasts is after you feed your baby or after you use a breast pump. If you get monthly bleeding, the best time to check your breasts is 5-7 days after your monthly cycle ends. With time, you will become comfortable with the self-exam. You will  also start to know if there are changes in your breasts. Contact a doctor if: You see a change in the shape or size of your breasts or nipples. You see a change in the skin of your breast or nipples, such as red or scaly skin. You have fluid coming from your nipples that is not normal. You find a new lump or thick area. You have breast pain. You have any concerns about your breast health. Summary Breast self-awareness includes looking for changes in your breasts and feeling for changes within your breasts. You should do breast self-awareness in front of a mirror in a well-lit room. If you get monthly periods (menstrual cycles), the best time to check your breasts is 5-7 days after your period ends. Tell your doctor about any changes you see in your breasts. Changes include changes in size, changes on the skin, painful or tender breasts, or fluid from your nipples that is not normal. This information is not intended to replace advice given to you by your health care provider. Make sure you discuss any questions you have with your health care provider. Document Revised: 11/02/2021 Document Reviewed: 03/30/2021 Elsevier Patient Education  2024 [employer].

## 2023-07-25 NOTE — Progress Notes (Unsigned)
GYNECOLOGY ANNUAL PHYSICAL EXAM PROGRESS NOTE  Subjective:    Peggy Roy is a 28 y.o. G36P0101 female who presents for an annual exam.  Current complaints: none The patient is sexually active, same partner. The patient wears seatbelts: yes. The patient participates in regular exercise: yes. Has the patient ever been transfused or tattooed?: yes, tattoos. The patient reports that there is not domestic violence in her life.   The patient has the following complaints today: Reports occasional post-coital spotting.  Has a new partner, is currently sexually active. Wonders if it could be due to trauma of the cervix during intercourse, or due to her IUD (has had in place for 6 years). Denies vaginal discharge, odor, or pelvic pain.   Menstrual History: No LMP recorded. (Menstrual status: IUD).    Gynecologic History No LMP recorded. (Menstrual status: IUD).  Contraception: Mirena IUD. Inserted 07/2017.  Last Pap: 09/08/2019. Results were: normal.      Upstream - 07/26/23 0926       Pregnancy Intention Screening   Does the patient want to become pregnant in the next year? No    Does the patient's partner want to become pregnant in the next year? N/A    Would the patient like to discuss contraceptive options today? No      Contraception Wrap Up   Current Method IUD or IUS    End Method IUD or IUS    Contraception Counseling Provided Yes    How was the end contraceptive method provided? N/A            The pregnancy intention screening data noted above was reviewed. Potential methods of contraception were discussed. The patient elected to proceed with IUD or IUS.   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   Exercise induced bronchospasm    Headache(784.0)    migraines/tension   Vision abnormalities    myopia/astigmatism    Past Surgical History:  Procedure Laterality Date   CESAREAN SECTION N/A 04/19/2013   Procedure: CESAREAN SECTION;  Surgeon: Leslie Andrea, MD;  Location: [facility];  Service: Obstetrics;  Laterality: N/A;   LIPOMA EXCISION Right 01/23/2014    Family History  Problem Relation Age of Onset   Asthma Brother    Miscarriages / [place] Sister    Arthritis Maternal Grandmother    Cancer Maternal Grandmother        breast   Diabetes Maternal Grandmother    Cancer Maternal Grandfather        prostate   Diabetes Maternal Grandfather    Dementia Maternal Grandfather    Cancer Paternal Grandmother        bladder   Depression Paternal Grandmother    Healthy Mother    Healthy Father    Alcohol abuse Neg Hx    Birth defects Neg Hx    COPD Neg Hx    Drug abuse Neg Hx    Early death Neg  Hx    Hearing loss Neg Hx    Heart disease Neg Hx    Hyperlipidemia Neg Hx    Hypertension Neg Hx    Kidney disease Neg Hx    Learning disabilities Neg Hx    Mental illness Neg Hx    Mental retardation Neg Hx    Stroke Neg Hx    Vision loss Neg Hx    Varicose Veins Neg Hx     Social History   Socioeconomic History   Marital status: Single    Spouse name: Not on file   Number of children: 1   Years of education: Not on file   Highest education level: Not on file  Occupational History   Not on file  Tobacco Use   Smoking status: Never   Smokeless tobacco: Never  Vaping Use   Vaping status: Never Used  Substance and Sexual Activity   Alcohol use: Yes    Comment: occas   Drug use: No   Sexual activity: Yes    Birth control/protection: Condom, I.U.D.  Other Topics Concern   Not on file  Social History Narrative   Beginning freshman year at [employer]   Has a 26mo daughter who will staying with Kennetha's mom while she is at school   Social Drivers of Corporate investment banker Strain: Not on file  Food Insecurity: Not on file  Transportation Needs: Not on file  Physical Activity: Not on file  Stress: Not on file  Social Connections: Not on file  Intimate Partner Violence: Not on file    Current Outpatient Medications on File Prior to Visit  Medication Sig Dispense Refill   acetaminophen (TYLENOL) 500 MG tablet Take 500 mg by mouth once.     Levonorgestrel (MIRENA, 52 MG, IU) 1 Device by Intrauterine route.     valACYclovir (VALTREX) 1000 MG tablet Take 1 tablet (1,000 mg total) by mouth daily. Take 1 tab for 5 days for active outbreak. 90 tablet 2   No current facility-administered medications on file prior to visit.    No Known Allergies   Review of Systems Constitutional: negative for chills, fatigue, fevers and sweats Eyes: negative for irritation, redness and visual disturbance Ears, nose, mouth, throat, and face: negative for hearing loss, nasal congestion, snoring and tinnitus Respiratory: negative for asthma, cough, sputum Cardiovascular: negative for chest pain, dyspnea, exertional chest pressure/discomfort, irregular heart beat, palpitations and syncope Gastrointestinal: negative for abdominal pain, change in bowel habits, nausea and vomiting Genitourinary: negative for abnormal menstrual periods, genital lesions, sexual problems and vaginal discharge, dysuria and urinary incontinence. Positive for postcoital spotting.  Integument/breast: negative for breast lump, breast tenderness and nipple discharge Hematologic/lymphatic: negative for bleeding and easy bruising Musculoskeletal:negative for back pain and muscle weakness Neurological: negative for dizziness, headaches, vertigo and weakness Endocrine: negative for diabetic symptoms including polydipsia, polyuria and skin dryness Allergic/Immunologic:  negative for hay fever and urticaria      Objective:   Blood pressure 119/75, pulse 85, height 5\' 2"  (1.575 m), weight 140 lb 9.6 oz (63.8 kg).  Body mass index is 25.72 kg/m.    General Appearance:    Alert, cooperative, no distress, appears stated age  Head:    Normocephalic, without obvious abnormality, atraumatic  Eyes:    PERRL, conjunctiva/corneas clear, EOM's intact, both eyes  Ears:    Normal external ear canals, both ears  Nose:   Nares normal, septum midline, mucosa normal, no drainage or sinus tenderness  Throat:  Lips, mucosa, and tongue normal; teeth and gums normal  Neck:   Supple, symmetrical, trachea midline, no adenopathy; thyroid: no enlargement/tenderness/nodules; no carotid bruit or JVD  Back:     Symmetric, no curvature, ROM normal, no CVA tenderness  Lungs:     Clear to auscultation bilaterally, respirations unlabored  Chest Wall:    No tenderness or deformity   Heart:    Regular rate and rhythm, S1 and S2 normal, no murmur, rub or gallop  Breast Exam:    No tenderness, masses, or nipple abnormality  Abdomen:     Soft, non-tender, bowel sounds active all four quadrants, no masses, no organomegaly.    Genitalia:    Pelvic:external genitalia normal, vagina without lesions, discharge, or tenderness, rectovaginal septum  normal. Cervix normal in appearance, no cervical motion tenderness, no adnexal masses or tenderness.  Uterus normal size, shape, mobile, regular contours, nontender.  Rectal:    Normal external sphincter.  No hemorrhoids appreciated. Internal exam not done.   Extremities:   Extremities normal, atraumatic, no cyanosis or edema  Pulses:   2+ and symmetric all extremities  Skin:   Skin color, texture, turgor normal, no rashes or lesions  Lymph nodes:   Cervical, supraclavicular, and axillary nodes normal  Neurologic:   CNII-XII intact, normal strength, sensation and reflexes throughout   .  Labs:  Lab Results  Component Value Date   WBC 2.9 (L)  05/16/2022   HGB 14.6 05/16/2022   HCT 42.5 05/16/2022   MCV 98 (H) 05/16/2022   PLT 277 05/16/2022    Lab Results  Component Value Date   CREATININE 1.00 05/16/2022   BUN 12 05/16/2022   NA 143 05/16/2022   K 4.5 05/16/2022   CL 105 05/16/2022   CO2 24 05/16/2022    Lab Results  Component Value Date   ALT 12 05/16/2022   AST 16 05/16/2022   ALKPHOS 73 05/16/2022   BILITOT 0.5 05/16/2022    Lab Results  Component Value Date   TSH 0.749 05/16/2022     Assessment:   1. Encounter for well woman exam with routine gynecological exam   2. Cervical cancer screening   3. Screening for STD (sexually transmitted disease)   4. IUD (intrauterine device) in place   5. Postcoital bleeding      Plan:  - Blood tests: CBC with diff, Comprehensive metabolic panel, and TSH. - Breast self exam technique reviewed and patient encouraged to perform self-exam monthly. -Contraception:Mirena IUD. Due for replacement in 2 years.  - Discussed healthy lifestyle modifications. - Pap smear ordered. - Desires STD screen with GC/CT. Denies concerns for exposure but just desires to be screened.  - Advised that postcoital bleeding could be due to mild cervical trauma (notes new partner is well-endowed). Can rule out other causes such as cervicitis, vaginal culture performed. Also discussed possible replacement of IUD earlier if no other causes noted.  - Flu vaccine: declines - Follow up in 1 year for annual exam   Hildred Laser, MD [facility] at [facility]

## 2023-07-26 ENCOUNTER — Other Ambulatory Visit (HOSPITAL_COMMUNITY)
Admission: RE | Admit: 2023-07-26 | Discharge: 2023-07-26 | Disposition: A | Discharge status: Home / Self Care | Payer: 59 | Source: Ambulatory Visit | Attending: Obstetrics and Gynecology | Admitting: Obstetrics and Gynecology

## 2023-07-26 ENCOUNTER — Encounter: Payer: Self-pay | Admitting: Obstetrics and Gynecology

## 2023-07-26 ENCOUNTER — Ambulatory Visit (INDEPENDENT_AMBULATORY_CARE_PROVIDER_SITE_OTHER): Payer: 59 | Admitting: Obstetrics and Gynecology

## 2023-07-26 VITALS — BP 119/75 | HR 85 | Ht 62.0 in | Wt 140.6 lb

## 2023-07-26 DIAGNOSIS — Z01419 Encounter for gynecological examination (general) (routine) without abnormal findings: Secondary | ICD-10-CM | POA: Insufficient documentation

## 2023-07-26 DIAGNOSIS — Z131 Encounter for screening for diabetes mellitus: Secondary | ICD-10-CM

## 2023-07-26 DIAGNOSIS — Z975 Presence of (intrauterine) contraceptive device: Secondary | ICD-10-CM

## 2023-07-26 DIAGNOSIS — Z13220 Encounter for screening for lipoid disorders: Secondary | ICD-10-CM

## 2023-07-26 DIAGNOSIS — N930 Postcoital and contact bleeding: Secondary | ICD-10-CM

## 2023-07-26 DIAGNOSIS — Z113 Encounter for screening for infections with a predominantly sexual mode of transmission: Secondary | ICD-10-CM | POA: Insufficient documentation

## 2023-07-26 DIAGNOSIS — Z124 Encounter for screening for malignant neoplasm of cervix: Secondary | ICD-10-CM | POA: Diagnosis present

## 2023-07-27 LAB — CBC
Hematocrit: 45.1 % (ref 34.0–46.6)
Hemoglobin: 14.6 g/dL (ref 11.1–15.9)
MCH: 32.4 pg (ref 26.6–33.0)
MCHC: 32.4 g/dL (ref 31.5–35.7)
MCV: 100 fL — ABNORMAL HIGH (ref 79–97)
Platelets: 285 10*3/uL (ref 150–450)
RBC: 4.51 x10E6/uL (ref 3.77–5.28)
RDW: 12.3 % (ref 11.7–15.4)
WBC: 3.2 10*3/uL — ABNORMAL LOW (ref 3.4–10.8)

## 2023-07-27 LAB — COMPREHENSIVE METABOLIC PANEL
ALT: 16 [IU]/L (ref 0–32)
AST: 23 [IU]/L (ref 0–40)
Albumin: 4.3 g/dL (ref 4.0–5.0)
Alkaline Phosphatase: 81 [IU]/L (ref 44–121)
BUN/Creatinine Ratio: 15 (ref 9–23)
BUN: 14 mg/dL (ref 6–20)
Bilirubin Total: 0.6 mg/dL (ref 0.0–1.2)
CO2: 23 mmol/L (ref 20–29)
Calcium: 9.7 mg/dL (ref 8.7–10.2)
Chloride: 105 mmol/L (ref 96–106)
Creatinine, Ser: 0.94 mg/dL (ref 0.57–1.00)
Globulin, Total: 3 g/dL (ref 1.5–4.5)
Glucose: 82 mg/dL (ref 70–99)
Potassium: 4.4 mmol/L (ref 3.5–5.2)
Sodium: 142 mmol/L (ref 134–144)
Total Protein: 7.3 g/dL (ref 6.0–8.5)
eGFR: 85 mL/min/{1.73_m2} (ref >59)

## 2023-07-29 ENCOUNTER — Encounter: Payer: Self-pay | Admitting: Obstetrics and Gynecology

## 2023-07-29 LAB — CYTOLOGY - PAP
Chlamydia: NEGATIVE
Comment: NEGATIVE
Comment: NEGATIVE
Comment: NORMAL
Diagnosis: NEGATIVE
Neisseria Gonorrhea: NEGATIVE
Trichomonas: NEGATIVE
